# Patient Record
Sex: Male | Born: 1955 | Race: White | Hispanic: No | Marital: Married | Smoking: Never smoker
Health system: Southern US, Community
[De-identification: ages and names within clinical notes are randomized; demographics above are authoritative.]

## PROBLEM LIST (undated history)

## (undated) DIAGNOSIS — B0229 Other postherpetic nervous system involvement: Secondary | ICD-10-CM

## (undated) DIAGNOSIS — E785 Hyperlipidemia, unspecified: Secondary | ICD-10-CM

## (undated) DIAGNOSIS — M5432 Sciatica, left side: Secondary | ICD-10-CM

## (undated) DIAGNOSIS — Q632 Ectopic kidney: Secondary | ICD-10-CM

## (undated) DIAGNOSIS — R519 Headache, unspecified: Secondary | ICD-10-CM

## (undated) DIAGNOSIS — K219 Gastro-esophageal reflux disease without esophagitis: Secondary | ICD-10-CM

## (undated) DIAGNOSIS — T7840XA Allergy, unspecified, initial encounter: Secondary | ICD-10-CM

## (undated) HISTORY — PX: RHINOPLASTY: SUR1284

## (undated) HISTORY — DX: Hyperlipidemia, unspecified: E78.5

## (undated) HISTORY — PX: HERNIA REPAIR: SHX51

## (undated) HISTORY — PX: KNEE SURGERY: SHX244

## (undated) HISTORY — PX: EPIDURAL BLOCK INJECTION: SHX1516

## (undated) HISTORY — PX: SPINE SURGERY: SHX786

## (undated) HISTORY — DX: Allergy, unspecified, initial encounter: T78.40XA

## (undated) HISTORY — DX: Gastro-esophageal reflux disease without esophagitis: K21.9

## (undated) HISTORY — DX: Sciatica, left side: M54.32

## (undated) HISTORY — DX: Other postherpetic nervous system involvement: B02.29

---
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
## 2005-05-23 HISTORY — PX: LUMBAR DISC SURGERY: SHX700

## 2017-02-23 ENCOUNTER — Ambulatory Visit (INDEPENDENT_AMBULATORY_CARE_PROVIDER_SITE_OTHER): Payer: BC Managed Care – PPO | Admitting: Family Medicine

## 2017-02-23 ENCOUNTER — Encounter: Payer: Self-pay | Admitting: Family Medicine

## 2017-02-23 VITALS — BP 131/79 | HR 60 | Temp 97.9°F | Ht 69.5 in | Wt 185.0 lb

## 2017-02-23 DIAGNOSIS — E785 Hyperlipidemia, unspecified: Secondary | ICD-10-CM | POA: Insufficient documentation

## 2017-02-23 DIAGNOSIS — M5442 Lumbago with sciatica, left side: Secondary | ICD-10-CM | POA: Diagnosis not present

## 2017-02-23 NOTE — Progress Notes (Signed)
   Subjective:    Patient ID: Willie Carpenter, male    DOB: [birth year], 61 y.o.   MRN: [id number]  HPI 61 yr old male to establish with [place] after moving to [place] from [place] about 4 weeks ago. 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 He has a history of low back pain and he had lumbar disc surgery in 2007. This has been well controlled over the year with minor flare ups until the past 2 weeks. Now he is having more severe sharp pains in the left lower back which radiate into the left buttock and left posterior thigh. No numbness or weakness. He has been doing a lot of packing, unpacking, and lifting heavy boxes lately. He has used heat and Ibuprofen with mixed results. He does stretches for his hamstrings. He asks to be referred for PT for this.    Review of Systems  Constitutional: Negative.   Respiratory: Negative.   Cardiovascular: Negative.   Musculoskeletal: Positive for back pain. Negative for gait problem.  Neurological: Negative.        Objective:   Physical Exam  Constitutional: He is oriented to person, place, and time. He appears well-developed and well-nourished. No distress.  Normal gait   Cardiovascular: Normal rate, regular rhythm, normal heart sounds and intact distal pulses.   Pulmonary/Chest: Effort normal and breath sounds normal.  Musculoskeletal:  The lower back is not tender but there is some minor spasm present. ROM is full and SLR are negative.   Neurological: He is alert and oriented to person, place, and time.          Assessment & Plan:  Introductory visit with a patient having low back pain with sciatica. He will use heat and Ibuprofen prn. We will refer him to PT for further treatment. Once this settles down we will plan on giving hi a well exam and getting fasting labs.  Gershon Crane, MD

## 2017-02-23 NOTE — Patient Instructions (Signed)
WE NOW OFFER   [facility] FAST TRACK!!!  SAME DAY Appointments for ACUTE CARE  Such as: Sprains, Injuries, cuts, abrasions, rashes, muscle pain, joint pain, back pain Colds, flu, sore throats, headache, allergies, cough, fever  Ear pain, sinus and eye infections Abdominal pain, nausea, vomiting, diarrhea, upset stomach Animal/insect bites  3 Easy Ways to Schedule: Walk-In Scheduling Call in scheduling Mychart Sign-up: [email or web address]         

## 2017-02-28 ENCOUNTER — Ambulatory Visit: Payer: BC Managed Care – PPO | Attending: Family Medicine | Admitting: Physical Therapy

## 2017-02-28 DIAGNOSIS — M5442 Lumbago with sciatica, left side: Secondary | ICD-10-CM | POA: Insufficient documentation

## 2017-02-28 DIAGNOSIS — M6281 Muscle weakness (generalized): Secondary | ICD-10-CM | POA: Insufficient documentation

## 2017-02-28 NOTE — Therapy (Signed)
[facility] [address] [place], [place], [address] Phone: [phone]   Fax:  [phone]  Physical Therapy Evaluation  Patient Details  Name: Willie Carpenter MRN: [id number] Date of Birth: [birth year] Referring Provider: Dr. Gershon Crane  Encounter Date: 02/28/2017      PT End of Session - 02/28/17 2155    Visit Number 1   Date for PT Re-Evaluation 04/25/17   Authorization Type BCBS   PT Start Time 1532   PT Stop Time 1615   PT Time Calculation (min) 43 min   Activity Tolerance Patient tolerated treatment well      Past Medical History:  Diagnosis Date  . Hyperlipidemia   . Postherpetic neuralgia   . Sciatica, left side     Past Surgical History:  Procedure Laterality Date  . COLONOSCOPY  2006   clear, done in [place]   . EPIDURAL BLOCK INJECTION  Feb 2008 and May 2008   lumbar, in [place]   . LUMBAR DISC SURGERY  2007   in [place], at L5-S1   . SPINE SURGERY    . TONSILLECTOMY AND ADENOIDECTOMY  1972    There were no vitals filed for this visit.       Subjective Assessment - 02/28/17 1531    Subjective In 2007 had HNP with Left LE symptoms.  Had 2 nerve blocks subsided in 6 months.  Last few weeks had a twinge down left side especially with reaching overhead or with prolonged sitting.  Uses standing desk.  Did a lot of traveling in July ([place] and [place]).  Moving packing and unpacking may have strained my back.  Was going to Pilates class 1x/week in [place] and physiotherapist after knee surgery.  Not as physically active recently.  Rides bike.      Limitations Sitting;Lifting   Diagnostic tests not recently   Patient Stated Goals refer to good Pilates class;  improve flexibilty of hamstrings   Currently in Pain? No/denies   Pain Score 0-No pain   Pain Location Hip   Pain Orientation Left   Pain Type Acute pain   Pain Radiating Towards left thigh posterior to the knee    Aggravating Factors  reaching overhead, sitting; riding bike?; walk several miles around [facility]   Pain Relieving Factors activity            [facility] PT Assessment - 02/28/17 0001      Assessment   Medical Diagnosis acute low back pain with left sciatica   Referring Provider Dr. Gershon Crane   Onset Date/Surgical Date --  3 weeks   Next MD Visit as needed   Prior Therapy in [place]     Precautions   Precautions None     Restrictions   Weight Bearing Restrictions No     Balance Screen   Has the patient fallen in the past 6 months No   Has the patient had a decrease in activity level because of a fear of falling?  No   Is the patient reluctant to leave their home because of a fear of falling?  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[employer], running, pilates     Observation/Other Assessments   Focus  on Therapeutic Outcomes (FOTO)  46% limitation     Posture/Postural Control   Posture/Postural Control Postural limitations   Postural Limitations Decreased lumbar lordosis     AROM   Lumbar Flexion 40   Lumbar Extension 20   Lumbar - Right Side Bend 35   Lumbar - Left Side Bend 35     Strength   Lumbar Flexion 4/5   Lumbar Extension 4/5     Flexibility   Hamstrings 75 bil     Palpation   Palpation comment no tenderness     Slump test   Findings Negative     Prone Knee Bend Test   Findings Negative     Straight Leg Raise   Findings Negative            Objective measurements completed on examination: See above findings.                  PT Education - 02/28/17 1609    Education provided Yes   Education Details Hamstring stretch (also with contract-relax method) prone press ups;  community options for Pilates   Person(s)  Educated Patient   Methods Explanation;Demonstration;Handout   Comprehension Verbalized understanding;Returned demonstration          PT Short Term Goals - 02/28/17 2204      PT SHORT TERM GOAL #1   Title The patient will express understanding of basic self care strategies including postural considerations, basic HEP   Time 4   Period Weeks   Status New   Target Date 03/28/17     PT SHORT TERM GOAL #2   Title The patient will report a 40% improvement in back, hip and thigh symptoms with usual home, work activities   Time 4   Period Weeks   Status New     PT SHORT TERM GOAL #3   Title Lumbar flexion to 55 degrees and HS length to 80 degrees needed for playing golf and other recreational activities   Time 4   Period Weeks   Status New           PT Long Term Goals - 02/28/17 2207      PT LONG TERM GOAL #1   Title The patient will be independent in safe self progression of HEP    Time 8   Period Weeks   Status New   Target Date 04/25/17     PT LONG TERM GOAL #2   Title The patient will report a 75% improvement in pain with usual home,work and recreational activities.   Time 8   Period Weeks   Status New     PT LONG TERM GOAL #3   Title Core strength improved to 5-/5 needed for moderate and vigorous recreational activities   Time 8   Period Weeks   Status New     PT LONG TERM GOAL #4   Title FOTO functional outcome score improved from 46% limitation to 29% limitation   Time 8   Period Weeks   Status New                Plan - 02/28/17 2156    Clinical Impression Statement The patient has a history of LBP/herniated disch and radiculopathy 10 years ago which resolved which nerve blocks, physiotherapy and Pilates while he lived in [place].  Over the summer, he traveled extensively and has relocated to [place].  He has been sitting more on long flights, lifting and moving boxes and  has not been as active.  As a result he has felt some left back,  hip and posterior thigh pain to the knee.  Lumbar flexion ROM is very limited.   Repeated movement testing reveals a possible preference for extension.  Decreased HS length to 75 degrees bilaterally which patient is very concerned about.  Decreased activation of transverse abdominus and lumbar multifidi.  Decreased lumbar lordosis.  No neural signs:  negative SLR, slump test and prone knee bend.  He would benefit from PT to address these deficits and help him return to previous level of functional activity.     History and Personal Factors relevant to plan of care: good home support;  minimal co-morbidities; previous back history   Clinical Presentation Stable   Clinical Decision Making Low   Rehab Potential Good   PT Frequency 2x / week   PT Duration 8 weeks   PT Treatment/Interventions ADLs/Self Care Home Management;Cryotherapy;Electrical Stimulation;Ultrasound;Moist Heat;Traction;Therapeutic activities;Therapeutic exercise;Neuromuscular re-education;Manual techniques;Dry needling;Taping   PT Next Visit Plan assess response to extension bias ex and add overpressure as appropriate;  manual stretch of HS;  add piriformis stretch;  abdominal brace series;  start lumbar multifidi activation      Patient will benefit from skilled therapeutic intervention in order to improve the following deficits and impairments:  Decreased strength, Decreased range of motion, Postural dysfunction, Pain  Visit Diagnosis: Acute left-sided low back pain with left-sided sciatica - Plan: PT plan of care cert/re-cert  Muscle weakness (generalized) - Plan: PT plan of care cert/re-cert     Problem List Patient Active Problem List   Diagnosis Date Noted  . Dyslipidemia 02/23/2017   Lavinia Sharps, PT 02/28/17 10:12 PM Phone: [phone] Fax: [phone]  Vivien Presto 02/28/2017, 10:11 PM  [facility] [address] [place], [place],  [address] Phone: [phone]   Fax:  [phone]  Name: Willie Carpenter MRN: [id number] Date of Birth: [birth year]

## 2017-02-28 NOTE — Patient Instructions (Signed)
       HAMSTRING STRETCH WITH TOWEL  While lying down on your back, hook a towel or strap under  your foot and draw up your leg until a stretch is felt under your leg. calf area.  Keep your knee in a straightened position during the stretch.  Hold 20 seconds, perform 3 repetitions.  3 times a day.    [facility] [address] [place], [place] [address] Phone # [phone] Fax [phone]     PT Pilates  Lavinia Sharps PT [facility] [address] [place], [place] [address] Phone # [phone] Fax [phone]

## 2017-03-02 ENCOUNTER — Ambulatory Visit: Payer: BC Managed Care – PPO | Admitting: Physical Therapy

## 2017-03-02 DIAGNOSIS — M6281 Muscle weakness (generalized): Secondary | ICD-10-CM

## 2017-03-02 DIAGNOSIS — M5442 Lumbago with sciatica, left side: Secondary | ICD-10-CM | POA: Diagnosis not present

## 2017-03-02 NOTE — Patient Instructions (Signed)
Isometric Oblique Stabilization  From a supine position with both knees bent, bring one knee up to 90 degrees. Resist the lifted knee with the opposite arm to contract the obliques while keeping the head and shoulders on the mat. There should be no movement of the knee during the oblique contraction x10 reps each side, 3 sec hold

## 2017-03-02 NOTE — Therapy (Signed)
[facility] [address] [place], [place], [address] Phone: [phone]   Fax:  [phone]  Physical Therapy Treatment  Patient Details  Name: Willie Carpenter MRN: [id number] Date of Birth: 1956-02-08 Referring Provider: Dr. Gershon Crane  Encounter Date: 03/02/2017      PT End of Session - 03/02/17 1038    Visit Number 2   Date for PT Re-Evaluation 04/25/17   Authorization Type BCBS   PT Start Time [phone]  Pt arrived late    PT Stop Time 1016   PT Time Calculation (min) 38 min   Activity Tolerance Patient tolerated treatment well;No increased pain   Behavior During Therapy WFL for tasks assessed/performed      Past Medical History:  Diagnosis Date  . Hyperlipidemia   . Postherpetic neuralgia   . Sciatica, left side     Past Surgical History:  Procedure Laterality Date  . COLONOSCOPY  2006   clear, done in [place]   . EPIDURAL BLOCK INJECTION  Feb 2008 and May 2008   lumbar, in [place]   . LUMBAR DISC SURGERY  2007   in [place], at L5-S1   . SPINE SURGERY    . TONSILLECTOMY AND ADENOIDECTOMY  1972    There were no vitals filed for this visit.      Subjective Assessment - 03/02/17 0939    Subjective Pt reports that things are going well. He has been working on his HEP since the last session. He still has some pain into his Lt hip region when reaching overhead or after completing alot of exercise.    Limitations Sitting;Lifting   Diagnostic tests not recently   Patient Stated Goals refer to good Pilates class;  improve flexibilty of hamstrings   Currently in Pain? No/denies                   [facility] Adult PT Treatment/Exercise - 03/02/17 0001      Exercises   Exercises Lumbar     Lumbar Exercises: Stretches   Active Hamstring Stretch 5 reps;10 seconds   Press Ups Limitations x10 reps    Piriformis Stretch 30 seconds;2 reps   Piriformis Stretch Limitations supine       Lumbar Exercises: Supine   Large Ball Abdominal Isometric 10 reps;3 seconds   Other Supine Lumbar Exercises BUE press into phyioball during breathing and abdominal bracing     Lumbar Exercises: Prone   Other Prone Lumbar Exercises pressups x10 reps beginning      Manual Therapy   Manual Therapy Muscle Energy Technique   Muscle Energy Technique contract, relax stretch to hamstrings each LE; B piriformis stretch                 PT Education - 03/02/17 1024    Education provided Yes   Education Details addition of deep abdominal activation to HEP; technique with therex   Person(s) Educated Patient   Methods Explanation;Tactile cues;Verbal cues;Handout   Comprehension Verbalized understanding;Returned demonstration          PT Short Term Goals - 02/28/17 2204      PT SHORT TERM GOAL #1   Title The patient will express understanding of basic self care strategies including postural considerations, basic HEP   Time 4   Period Weeks   Status New   Target Date 03/28/17     PT SHORT TERM GOAL #2   Title The patient will report a 40% improvement  in back, hip and thigh symptoms with usual home, work activities   Time 4   Period Weeks   Status New     PT SHORT TERM GOAL #3   Title Lumbar flexion to 55 degrees and HS length to 80 degrees needed for playing golf and other recreational activities   Time 4   Period Weeks   Status New           PT Long Term Goals - 02/28/17 2207      PT LONG TERM GOAL #1   Title The patient will be independent in safe self progression of HEP    Time 8   Period Weeks   Status New   Target Date 04/25/17     PT LONG TERM GOAL #2   Title The patient will report a 75% improvement in pain with usual home,work and recreational activities.   Time 8   Period Weeks   Status New     PT LONG TERM GOAL #3   Title Core strength improved to 5-/5 needed for moderate and vigorous recreational activities   Time 8   Period Weeks   Status New      PT LONG TERM GOAL #4   Title FOTO functional outcome score improved from 46% limitation to 29% limitation   Time 8   Period Weeks   Status New               Plan - 03/02/17 1038    Clinical Impression Statement Pt arrived today without report of pain, noting full HEP adherence since his evaluation 2 days ago. Session focused on manual stretching and therex to improve hip and lumbar mobility. Made additions to pt's HEP to incorporate transverse abdominus activation, noting overall good technique and understanding of today's exercises. Ended without report of low back pain. Will continue with current POC.      Rehab Potential Good   PT Frequency 2x / week   PT Duration 8 weeks   PT Treatment/Interventions ADLs/Self Care Home Management;Cryotherapy;Electrical Stimulation;Ultrasound;Moist Heat;Traction;Therapeutic activities;Therapeutic exercise;Neuromuscular re-education;Manual techniques;Dry needling;Taping   PT Next Visit Plan trial overpressure to prone pressups to assess symptom response; progress abdominal bracing therex;  start lumbar multifidi activation   Recommended Other Services MD signed orders   Consulted and Agree with Plan of Care Patient      Patient will benefit from skilled therapeutic intervention in order to improve the following deficits and impairments:  Decreased strength, Decreased range of motion, Postural dysfunction, Pain  Visit Diagnosis: Acute left-sided low back pain with left-sided sciatica  Muscle weakness (generalized)     Problem List Patient Active Problem List   Diagnosis Date Noted  . Dyslipidemia 02/23/2017   12:32 PM,03/02/17 Marylyn Ishihara PT, DPT [facility]  [phone]  [facility] [address] [place], [place], [address] Phone: [phone]   Fax:  [phone]  Name: Willie Carpenter MRN: [id number] Date of Birth:  31-Aug-1955

## 2017-03-09 ENCOUNTER — Ambulatory Visit: Payer: BC Managed Care – PPO

## 2017-03-09 DIAGNOSIS — M5442 Lumbago with sciatica, left side: Secondary | ICD-10-CM

## 2017-03-09 DIAGNOSIS — M6281 Muscle weakness (generalized): Secondary | ICD-10-CM

## 2017-03-09 NOTE — Therapy (Signed)
[facility] [address] [place], [place], [address] Phone: [phone]   Fax:  [phone]  Physical Therapy Treatment  Patient Details  Name: Willie Carpenter MRN: [id number] Date of Birth: [birth year] Referring Provider: Dr. Gershon Crane  Encounter Date: 03/09/2017      PT End of Session - 03/09/17 1228    Visit Number 3   Date for PT Re-Evaluation 04/25/17   PT Start Time 1155   PT Stop Time 1245   PT Time Calculation (min) 50 min   Activity Tolerance Patient tolerated treatment well   Behavior During Therapy [facility] for tasks assessed/performed      Past Medical History:  Diagnosis Date  . Hyperlipidemia   . Postherpetic neuralgia   . Sciatica, left side     Past Surgical History:  Procedure Laterality Date  . COLONOSCOPY  2006   clear, done in [place]   . EPIDURAL BLOCK INJECTION  Feb 2008 and May 2008   lumbar, in [place]   . LUMBAR DISC SURGERY  2007   in [place], at L5-S1   . SPINE SURGERY    . TONSILLECTOMY AND ADENOIDECTOMY  1972    There were no vitals filed for this visit.      Subjective Assessment - 03/09/17 1156    Subjective Pt reports no change in Lt LE radiculopathy.     Currently in Pain? Yes   Pain Score 4    Pain Location Hip   Pain Orientation Left   Pain Type Acute pain   Pain Onset More than a month ago   Pain Frequency Intermittent   Aggravating Factors  reaching overhead, rowing exercxie   Pain Relieving Factors activity, change of position                         [facility] Adult PT Treatment/Exercise - 03/09/17 0001      Lumbar Exercises: Stretches   Active Hamstring Stretch 3 reps;20 seconds   Piriformis Stretch 30 seconds;2 reps   Piriformis Stretch Limitations seated     Lumbar Exercises: Prone   Single Arm Raise Left;Right;20 reps   Straight Leg Raise 20 reps   Opposite Arm/Leg Raise Left arm/Right leg;Right arm/Left leg;20 reps   Other  Prone Lumbar Exercises pressups x10 reps beginning      Modalities   Modalities Traction     Traction   Type of Traction Lumbar   Min (lbs) 50   Max (lbs) 85   Hold Time 60   Rest Time 20   Time 15     Manual Therapy   Manual Therapy Myofascial release   Manual therapy comments contract relax with passive stretch to bil hamstring                PT Education - 03/09/17 1215    Education provided Yes   Education Details seated hamstring and piriformis, prone    Person(s) Educated Patient   Methods Explanation;Demonstration;Handout   Comprehension Verbalized understanding;Returned demonstration          PT Short Term Goals - 02/28/17 2204      PT SHORT TERM GOAL #1   Title The patient will express understanding of basic self care strategies including postural considerations, basic HEP   Time 4   Period Weeks   Status New   Target Date 03/28/17     PT SHORT TERM GOAL #2   Title The patient  will report a 40% improvement in back, hip and thigh symptoms with usual home, work activities   Time 4   Period Weeks   Status New     PT SHORT TERM GOAL #3   Title Lumbar flexion to 55 degrees and HS length to 80 degrees needed for playing golf and other recreational activities   Time 4   Period Weeks   Status New           PT Long Term Goals - 02/28/17 2207      PT LONG TERM GOAL #1   Title The patient will be independent in safe self progression of HEP    Time 8   Period Weeks   Status New   Target Date 04/25/17     PT LONG TERM GOAL #2   Title The patient will report a 75% improvement in pain with usual home,work and recreational activities.   Time 8   Period Weeks   Status New     PT LONG TERM GOAL #3   Title Core strength improved to 5-/5 needed for moderate and vigorous recreational activities   Time 8   Period Weeks   Status New     PT LONG TERM GOAL #4   Title FOTO functional outcome score improved from 46% limitation to 29% limitation    Time 8   Period Weeks   Status New               Plan - 03/09/17 1209    Clinical Impression Statement Pt with continued Lt buttock/hamstring pain.  PT issued HEP for seated flexibility and prone extensor strength.  Trial of traction today to impact Lt LE radiculopathy.  Pt tolerated all exercise without increased pain.  Pt will continue to benefit from skilled PT for core strength, flexibility and traction if helpful.     Rehab Potential Good   PT Frequency 2x / week   PT Duration 8 weeks   PT Treatment/Interventions ADLs/Self Care Home Management;Cryotherapy;Electrical Stimulation;Ultrasound;Moist Heat;Traction;Therapeutic activities;Therapeutic exercise;Neuromuscular re-education;Manual techniques;Dry needling;Taping   PT Next Visit Plan assess response to traction, continue if helpful.  reveiw HEP issued today.     Consulted and Agree with Plan of Care Patient      Patient will benefit from skilled therapeutic intervention in order to improve the following deficits and impairments:  Decreased strength, Decreased range of motion, Postural dysfunction, Pain  Visit Diagnosis: Acute left-sided low back pain with left-sided sciatica  Muscle weakness (generalized)     Problem List Patient Active Problem List   Diagnosis Date Noted  . Dyslipidemia 02/23/2017     Willie Carpenter, PT 03/09/17 12:31 PM  [facility] [address] [place], [place], [address] Phone: [phone]   Fax:  [phone]  Name: Willie Carpenter MRN: [id number] Date of Birth: July 10, 1955

## 2017-03-09 NOTE — Patient Instructions (Addendum)
HIP: Hamstrings - Short Sitting    Rest leg on raised surface. Keep knee straight. Lift chest. Hold _20__ seconds. _3__ reps per set, _3_ sets per day, ___ days per week  Copyright  VHI. All rights reserved.  Piriformis Stretch, Sitting    Sit, one ankle on opposite knee, same-side hand on crossed knee. Push down on knee, keeping spine straight. Lean torso forward, with flat back, until tension is felt in hamstrings and gluteals of crossed-leg side. Hold _20__ seconds.  Repeat _3__ times per session. Do _3__ sessions per day.  Copyright  VHI. All rights reserved.  Arm / Leg Lift: Opposite (Prone)   Lift right leg and opposite arm ____ inches from floor, keeping knee locked. Repeat __10__ times per set. Do __1-2__ sets per session. Do __1__ sessions per day.   Straight Leg Raise (Prone)   Abdomen and head supported, keep left knee locked and raise leg at hip. Avoid arching low back. Repeat _10___ times per set. Do _1-2___ sets per session. Do _2___ sessions per day.      Straight Arm Lift: Prone   Arm straight out STRAIGHT Keeping thumb up, lift arm. Keep pelvis still. Do 10 __ times, 1-2 sets, __1_ times per day.   [facility] [address], [address] [place], [place] [address] Phone # [phone] Fax [phone]

## 2017-03-13 ENCOUNTER — Ambulatory Visit: Payer: BC Managed Care – PPO

## 2017-03-13 DIAGNOSIS — M6281 Muscle weakness (generalized): Secondary | ICD-10-CM

## 2017-03-13 DIAGNOSIS — M5442 Lumbago with sciatica, left side: Secondary | ICD-10-CM

## 2017-03-13 NOTE — Therapy (Signed)
[facility] [address] [place], [place], [address] Phone: [phone]   Fax:  [phone]  Physical Therapy Treatment  Patient Details  Name: Willie Carpenter MRN: [id number] Date of Birth: March 24, 1956 Referring Provider: Dr. Gershon Crane  Encounter Date: 03/13/2017      PT End of Session - 03/13/17 1440    Visit Number 4   Date for PT Re-Evaluation 04/25/17   PT Start Time 1406   PT Stop Time 1457   PT Time Calculation (min) 51 min   Activity Tolerance Patient tolerated treatment well   Behavior During Therapy [facility] for tasks assessed/performed      Past Medical History:  Diagnosis Date  . Hyperlipidemia   . Postherpetic neuralgia   . Sciatica, left side     Past Surgical History:  Procedure Laterality Date  . COLONOSCOPY  2006   clear, done in [place]   . EPIDURAL BLOCK INJECTION  Feb 2008 and May 2008   lumbar, in [place]   . LUMBAR DISC SURGERY  2007   in [place], at L5-S1   . SPINE SURGERY    . TONSILLECTOMY AND ADENOIDECTOMY  1972    There were no vitals filed for this visit.      Subjective Assessment - 03/13/17 1408    Subjective I was achy after traction.  No change in symptoms.     Patient Stated Goals refer to good Pilates class;  improve flexibilty of hamstrings   Currently in Pain? Yes   Pain Score 4    Pain Location Hip   Pain Orientation Left   Pain Descriptors / Indicators Aching;Sore   Pain Type Acute pain   Pain Onset More than a month ago   Pain Frequency Intermittent   Aggravating Factors  reaching overhead, rowing   Pain Relieving Factors activity, change of position                         [facility] Adult PT Treatment/Exercise - 03/13/17 0001      Lumbar Exercises: Stretches   Active Hamstring Stretch 3 reps;20 seconds  seated and supine with strap   Piriformis Stretch 30 seconds;2 reps   Piriformis Stretch Limitations seated     Lumbar  Exercises: Supine   Bridge 5 seconds;20 reps     Lumbar Exercises: Sidelying   Clam 20 reps     Lumbar Exercises: Prone   Single Arm Raise Left;Right;20 reps   Straight Leg Raise 20 reps   Opposite Arm/Leg Raise Left arm/Right leg;Right arm/Left leg;20 reps   Other Prone Lumbar Exercises pressups x10 reps beginning      Modalities   Modalities Traction     Traction   Type of Traction Lumbar   Min (lbs) 50   Max (lbs) 85   Hold Time 60   Rest Time 20   Time 15                  PT Short Term Goals - 02/28/17 2204      PT SHORT TERM GOAL #1   Title The patient will express understanding of basic self care strategies including postural considerations, basic HEP   Time 4   Period Weeks   Status New   Target Date 03/28/17     PT SHORT TERM GOAL #2   Title The patient will report a 40% improvement in back, hip and thigh symptoms with usual home,  work activities   Time 4   Period Weeks   Status New     PT SHORT TERM GOAL #3   Title Lumbar flexion to 55 degrees and HS length to 80 degrees needed for playing golf and other recreational activities   Time 4   Period Weeks   Status New           PT Long Term Goals - 02/28/17 2207      PT LONG TERM GOAL #1   Title The patient will be independent in safe self progression of HEP    Time 8   Period Weeks   Status New   Target Date 04/25/17     PT LONG TERM GOAL #2   Title The patient will report a 75% improvement in pain with usual home,work and recreational activities.   Time 8   Period Weeks   Status New     PT LONG TERM GOAL #3   Title Core strength improved to 5-/5 needed for moderate and vigorous recreational activities   Time 8   Period Weeks   Status New     PT LONG TERM GOAL #4   Title FOTO functional outcome score improved from 46% limitation to 29% limitation   Time 8   Period Weeks   Status New               Plan - 03/13/17 1417    Clinical Impression Statement Pt reports  very minimal change in Lt buttock pain.  Pt has been compliant in body mechanics modifications and HEP for extension and flexibility.  Pt tolerates all exercise without increase in symptoms.  Pt is not sure of impact of traction and tried again today to determine if change in Lt leg symptoms.  Pt will continue to benefit from skilled PT for extension based exercise, strength and flexibility.     Rehab Potential Good   PT Frequency 2x / week   PT Duration 8 weeks   PT Treatment/Interventions ADLs/Self Care Home Management;Cryotherapy;Electrical Stimulation;Ultrasound;Moist Heat;Traction;Therapeutic activities;Therapeutic exercise;Neuromuscular re-education;Manual techniques;Dry needling;Taping   PT Next Visit Plan assess response to traction, continue if helpful.  Add bridging and clam shells to HEP.   Consulted and Agree with Plan of Care Patient      Patient will benefit from skilled therapeutic intervention in order to improve the following deficits and impairments:  Decreased strength, Decreased range of motion, Postural dysfunction, Pain  Visit Diagnosis: Acute left-sided low back pain with left-sided sciatica  Muscle weakness (generalized)     Problem List Patient Active Problem List   Diagnosis Date Noted  . Dyslipidemia 02/23/2017   Lorrene ReidKelly Wynter Grave, PT 03/13/17 2:41 PM   [facility] [address] [place], [place], [address] Phone: [phone]   Fax:  [phone]  Name: Willie Carpenter MRN: [id number] Date of Birth: April 22, 1956

## 2017-03-15 ENCOUNTER — Encounter: Payer: BC Managed Care – PPO | Admitting: Physical Therapy

## 2017-03-17 ENCOUNTER — Ambulatory Visit: Payer: BC Managed Care – PPO | Admitting: Physical Therapy

## 2017-03-17 DIAGNOSIS — M5442 Lumbago with sciatica, left side: Secondary | ICD-10-CM | POA: Diagnosis not present

## 2017-03-17 DIAGNOSIS — M6281 Muscle weakness (generalized): Secondary | ICD-10-CM

## 2017-03-17 NOTE — Patient Instructions (Signed)
  LUMBAR ROLL  Use a small lumbar roll in chairs you sit at frequently. Place the roll at the lower curve of your back. This will help you maintain better posture.    Towel roll.       Thoracic Rotation Mobility   Lying on your side with your top leg positioned as shown bring your top arm over without allowing your knee to lift off the ground. You should feel a gentle stretch in your upper back/thoracic spine.   x15 reps each side, hold 5 sec        Alternating Leg Extension (Quadruped)  Begin on hands and knees with knees directly under your hips and hands directly under your shoulders. Keep your abdominals tight and engaged throughout this exercise. Raise one leg straight back as pictured without letting your hips drop to one side and without losing your abdominal contraction. Hold for 2 seconds, then return to the start position and repeat with the opposite leg. This is one repetition. Hold 5 sec on the Lt leg, x10 reps each   [facility] [address], [address] [place], [place] [address] Phone # [phone] Fax [phone]

## 2017-03-17 NOTE — Therapy (Signed)
[facility] [address] [place], [place], [address] Phone: [phone]   Fax:  [phone]  Physical Therapy Treatment  Patient Details  Name: Willie Carpenter MRN: [id number] Date of Birth: [birth year] Referring Provider: Dr. Gershon Crane  Encounter Date: 03/17/2017      PT End of Session - 03/17/17 1128    Visit Number 5   Date for PT Re-Evaluation 04/25/17   PT Start Time 0935   PT Stop Time 1017   PT Time Calculation (min) 42 min   Activity Tolerance Patient tolerated treatment well;No increased pain   Behavior During Therapy [facility] for tasks assessed/performed      Past Medical History:  Diagnosis Date  . Hyperlipidemia   . Postherpetic neuralgia   . Sciatica, left side     Past Surgical History:  Procedure Laterality Date  . COLONOSCOPY  2006   clear, done in [place]   . EPIDURAL BLOCK INJECTION  Feb 2008 and May 2008   lumbar, in [place]   . LUMBAR DISC SURGERY  2007   in [place], at L5-S1   . SPINE SURGERY    . TONSILLECTOMY AND ADENOIDECTOMY  1972    There were no vitals filed for this visit.      Subjective Assessment - 03/17/17 0935    Subjective Pt reports that he thinks things might be improving, but it is still an annoyance for him. He has been trying to do some pilates on his own at home. He does not feel that the traction is doing much for him and requests we hold off on that for now.    Patient Stated Goals refer to good Pilates class;  improve flexibilty of hamstrings   Currently in Pain? Yes   Pain Score 3    Pain Location Buttocks   Pain Orientation Left   Pain Descriptors / Indicators Aching   Pain Type Acute pain   Pain Radiating Towards Lt posterior thigh   Pain Onset More than a month ago   Pain Frequency Intermittent   Aggravating Factors  reaching overhead, prolonged sitting    Pain Relieving Factors activity, change of position                           [facility] Adult PT Treatment/Exercise - 03/17/17 0001      Lumbar Exercises: Stretches   Active Hamstring Stretch 1 rep;30 seconds   Active Hamstring Stretch Limitations with strap to ensure pt is using proper technique at home.      Lumbar Exercises: Sidelying   Other Sidelying Lumbar Exercises thoracic rotation x10 reps, 5 sec hold each      Lumbar Exercises: Prone   Other Prone Lumbar Exercises Prone press ups x10 reps, x5 reps with 5 sec over pressure     Manual Therapy   Manual Therapy Soft tissue mobilization;Joint mobilization   Joint Mobilization CPAs L2 to S1, grade II-IV   Soft tissue mobilization STM Lt medial hamstring, Lt lumbar paraspinals, QL                 PT Education - 03/17/17 1128    Education provided Yes   Education Details additions to HEP    Person(s) Educated Patient   Methods Explanation;Handout   Comprehension Verbalized understanding;Returned demonstration          PT Short Term Goals - 02/28/17 2204      PT SHORT  TERM GOAL #1   Title The patient will express understanding of basic self care strategies including postural considerations, basic HEP   Time 4   Period Weeks   Status New   Target Date 03/28/17     PT SHORT TERM GOAL #2   Title The patient will report a 40% improvement in back, hip and thigh symptoms with usual home, work activities   Time 4   Period Weeks   Status New     PT SHORT TERM GOAL #3   Title Lumbar flexion to 55 degrees and HS length to 80 degrees needed for playing golf and other recreational activities   Time 4   Period Weeks   Status New           PT Long Term Goals - 02/28/17 2207      PT LONG TERM GOAL #1   Title The patient will be independent in safe self progression of HEP    Time 8   Period Weeks   Status New   Target Date 04/25/17     PT LONG TERM GOAL #2   Title The patient will report a 75% improvement in pain with usual home,work and  recreational activities.   Time 8   Period Weeks   Status New     PT LONG TERM GOAL #3   Title Core strength improved to 5-/5 needed for moderate and vigorous recreational activities   Time 8   Period Weeks   Status New     PT LONG TERM GOAL #4   Title FOTO functional outcome score improved from 46% limitation to 29% limitation   Time 8   Period Weeks   Status New               Plan - 03/17/17 1153    Clinical Impression Statement Pt arrives reporting some improvements in Lt buttock pain overall since the start of therapy. Session focused on manual techniques to address lumbar/thoracic mobility and decrease muscle spasm along the hamstring and Lt paraspinals. Introduced quadruped therex to address pt's core stability, noting increased difficulty on the Lt compared to the Rt. Updated pt's HEP to follow up with manual techniques performed and increase core stability asymmetries. Pt reported no increase in pain during today's session of following today's activities.    Rehab Potential Good   PT Frequency 2x / week   PT Duration 8 weeks   PT Treatment/Interventions ADLs/Self Care Home Management;Cryotherapy;Electrical Stimulation;Ultrasound;Moist Heat;Traction;Therapeutic activities;Therapeutic exercise;Neuromuscular re-education;Manual techniques;Dry needling;Taping   PT Next Visit Plan progress trunk stability/endurance; lumbar mobility   Consulted and Agree with Plan of Care Patient      Patient will benefit from skilled therapeutic intervention in order to improve the following deficits and impairments:  Decreased strength, Decreased range of motion, Postural dysfunction, Pain  Visit Diagnosis: Acute left-sided low back pain with left-sided sciatica  Muscle weakness (generalized)     Problem List Patient Active Problem List   Diagnosis Date Noted  . Dyslipidemia 02/23/2017    12:01 PM,03/17/17 Marylyn IshiharaSara Kiser PT, DPT [facility]   [phone]  [facility] [address] [place], [place], [address] Phone: [phone]   Fax:  [phone]  Name: Willie Carpenter MRN: [id number] Date of Birth: 03-04-1956

## 2017-03-21 ENCOUNTER — Ambulatory Visit: Payer: BC Managed Care – PPO | Admitting: Physical Therapy

## 2017-03-21 DIAGNOSIS — M5442 Lumbago with sciatica, left side: Secondary | ICD-10-CM | POA: Diagnosis not present

## 2017-03-21 DIAGNOSIS — M6281 Muscle weakness (generalized): Secondary | ICD-10-CM

## 2017-03-21 NOTE — Therapy (Signed)
[facility] [address] [place], [place], [address] Phone: [phone]   Fax:  [phone]  Physical Therapy Treatment  Patient Details  Name: Willie Carpenter MRN: [id number] Date of Birth: 29-Dec-1955 Referring Provider: Dr. Gershon CraneStephen Fry  Encounter Date: 03/21/2017      PT End of Session - 03/21/17 1017    Visit Number 6   Date for PT Re-Evaluation 04/25/17   Authorization Type BCBS   PT Start Time 0940   PT Stop Time 1018   PT Time Calculation (min) 38 min   Activity Tolerance Patient tolerated treatment well      Past Medical History:  Diagnosis Date  . Hyperlipidemia   . Postherpetic neuralgia   . Sciatica, left side     Past Surgical History:  Procedure Laterality Date  . COLONOSCOPY  2006   clear, [place]   . EPIDURAL BLOCK INJECTION  Feb 2008 and May 2008   [place], [place]   . LUMBAR DISC SURGERY  2007   in [place], at L5-S1   . SPINE SURGERY    . TONSILLECTOMY AND ADENOIDECTOMY  1972    There were no vitals filed for this visit.      Subjective Assessment - 03/21/17 0942    Subjective I think there is a little progress.  Some tenderness low back and buttock.  I think the stretching helps.  I'm being careful with my movements.  I do the [place] and HS the most often.  I get a little achy after I bike or jog.     Patient Stated Goals refer to good Pilates class;  improve flexibilty of hamstrings   Currently in Pain? Yes   Pain Score 2    Pain Location Buttocks   Pain Orientation Left   Pain Descriptors / Indicators Aching   Aggravating Factors  sitting   Pain Relieving Factors standing, activity                    Discussion of current HEP and response.       [facility] Adult PT Treatment/Exercise - 03/21/17 0001      Lumbar Exercises: Stretches   Active Hamstring Stretch 5 reps;20 seconds   Active Hamstring Stretch Limitations with strap with abduction  and adduction bias   Piriformis Stretch 30 seconds;2 reps   Piriformis Stretch Limitations supine      Lumbar Exercises: Prone   Other Prone Lumbar Exercises Press ups with belt fixation 10x     Manual Therapy   Joint Mobilization left hip long axis distraction, inferior mob, AP in internal rotation; prone PA, in internal rotation and external rotation grad 3 3x 30 sec   Muscle Energy Technique contract, relax stretch to hamstrings each LE; B piriformis stretch                   PT Short Term Goals - 02/28/17 2204      PT SHORT TERM GOAL #1   Title The patient will express understanding of basic self care strategies including postural considerations, basic HEP   Time 4   Period Weeks   Status New   Target Date 03/28/17     PT SHORT TERM GOAL #2   Title The patient will report a 40% improvement in back, hip and thigh symptoms with usual home, work activities   Time 4   Period Weeks   Status New  PT SHORT TERM GOAL #3   Title Lumbar flexion to 55 degrees and HS length to 80 degrees needed for playing golf and other recreational activities   Time 4   Period Weeks   Status New           PT Long Term Goals - 02/28/17 2207      PT LONG TERM GOAL #1   Title The patient will be independent in safe self progression of HEP    Time 8   Period Weeks   Status New   Target Date 04/25/17     PT LONG TERM GOAL #2   Title The patient will report a 75% improvement in pain with usual home,work and recreational activities.   Time 8   Period Weeks   Status New     PT LONG TERM GOAL #3   Title Core strength improved to 5-/5 needed for moderate and vigorous recreational activities   Time 8   Period Weeks   Status New     PT LONG TERM GOAL #4   Title FOTO functional outcome score improved from 46% limitation to 29% limitation   Time 8   Period Weeks   Status New               Plan - 03/21/17 1018    Clinical Impression Statement The patient feels he is  making progress and does not feel like the addition of dry needling is necessary at this time although he has had DN on his neck in [place] a few years ago and is receptive.  Good response with hip mobilization and adding overpressure to lumbar extensions.  Hamstring length has improved significantly since initial evaluation.  On track to meet STGs next week.     Rehab Potential Good   PT Frequency 2x / week   PT Duration 8 weeks   PT Treatment/Interventions ADLs/Self Care Home Management;Cryotherapy;Electrical Stimulation;Ultrasound;Moist Heat;Traction;Therapeutic activities;Therapeutic exercise;Neuromuscular re-education;Manual techniques;Dry needling;Taping   PT Next Visit Plan progress trunk stability/endurance; lumbar mobility;  hip mobility;  STGS 11/6      Patient will benefit from skilled therapeutic intervention in order to improve the following deficits and impairments:  Decreased strength, Decreased range of motion, Postural dysfunction, Pain  Visit Diagnosis: Acute left-sided low back pain with left-sided sciatica  Muscle weakness (generalized)     Problem List Patient Active Problem List   Diagnosis Date Noted  . Dyslipidemia 02/23/2017   Lavinia Sharps, PT 03/21/17 11:29 AM Phone: [phone] Fax: [phone] Vivien Presto 03/21/2017, 11:29 AM  [facility] [address] [place], [place], [address] Phone: [phone]   Fax:  [phone]  Name: Willie Carpenter MRN: [id number] Date of Birth: [birth year]

## 2017-03-23 ENCOUNTER — Ambulatory Visit: Payer: BC Managed Care – PPO | Attending: Family Medicine | Admitting: Physical Therapy

## 2017-03-23 DIAGNOSIS — M6281 Muscle weakness (generalized): Secondary | ICD-10-CM | POA: Insufficient documentation

## 2017-03-23 DIAGNOSIS — M5442 Lumbago with sciatica, left side: Secondary | ICD-10-CM | POA: Insufficient documentation

## 2017-03-23 NOTE — Patient Instructions (Signed)
Sharyn Brilliant PT [facility] [address], [address] [place], [place] [address] Phone # [phone] Fax [phone]    

## 2017-03-23 NOTE — Therapy (Signed)
[facility] [address] [place], [place], [address] Phone: [phone]   Fax:  [phone]  Physical Therapy Treatment  Patient Details  Name: Willie Carpenter MRN: [id number] Date of Birth: [birth year] Referring Provider: Dr. Gershon Crane  Encounter Date: 03/23/2017      PT End of Session - 03/23/17 1056    Visit Number 7   Date for PT Re-Evaluation 04/25/17   Authorization Type BCBS   PT Start Time 0931   PT Stop Time 1015   PT Time Calculation (min) 44 min   Activity Tolerance Patient tolerated treatment well      Past Medical History:  Diagnosis Date  . Hyperlipidemia   . Postherpetic neuralgia   . Sciatica, left side     Past Surgical History:  Procedure Laterality Date  . COLONOSCOPY  2006   clear, done in [place]   . EPIDURAL BLOCK INJECTION  Feb 2008 and May 2008   lumbar, in [place]   . LUMBAR DISC SURGERY  2007   in [place], at L5-S1   . SPINE SURGERY    . TONSILLECTOMY AND ADENOIDECTOMY  1972    There were no vitals filed for this visit.      Subjective Assessment - 03/23/17 0930    Subjective Not bad today.  I was achy after last visit.  I went for a little jog and it was still achy with referred pain down the left posterior thigh.  The extensions with the belt seemed to irritate me.  Also with achiness in lower back.   Currently in Pain? Yes   Pain Score 2    Pain Location Leg   Pain Orientation Left   Pain Type Acute pain   Aggravating Factors  reaching in the shower and leaning back;  prolonged sitting    Pain Relieving Factors stretching                         [facility] Adult PT Treatment/Exercise - 03/23/17 0001      Lumbar Exercises: Stretches   Active Hamstring Stretch 5 reps;20 seconds   Active Hamstring Stretch Limitations with strap with abduction and adduction bias   Double Knee to Chest Stretch 5 reps   Quadruped Mid Back Stretch Limitations  prayer stretch after multifidi ex   Piriformis Stretch 30 seconds;2 reps   Piriformis Stretch Limitations supine with ball     Lumbar Exercises: Seated   Sit to Stand Limitations neural gliding 10x bil     Lumbar Exercises: Prone   Other Prone Lumbar Exercises pressups x10 reps beginning    Other Prone Lumbar Exercises multifidi series 5x each with 1 pillow per HEP                PT Education - 03/23/17 1013    Education provided Yes   Education Details prone multifidi, neural flossing    Person(s) Educated Patient   Methods Explanation;Demonstration;Handout   Comprehension Verbalized understanding;Returned demonstration          PT Short Term Goals - 02/28/17 2204      PT SHORT TERM GOAL #1   Title The patient will express understanding of basic self care strategies including postural considerations, basic HEP   Time 4   Period Weeks   Status New   Target Date 03/28/17     PT SHORT TERM GOAL #2   Title The patient will report a 40%  improvement in back, hip and thigh symptoms with usual home, work activities   Time 4   Period Weeks   Status New     PT SHORT TERM GOAL #3   Title Lumbar flexion to 55 degrees and HS length to 80 degrees needed for playing golf and other recreational activities   Time 4   Period Weeks   Status New           PT Long Term Goals - 02/28/17 2207      PT LONG TERM GOAL #1   Title The patient will be independent in safe self progression of HEP    Time 8   Period Weeks   Status New   Target Date 04/25/17     PT LONG TERM GOAL #2   Title The patient will report a 75% improvement in pain with usual home,work and recreational activities.   Time 8   Period Weeks   Status New     PT LONG TERM GOAL #3   Title Core strength improved to 5-/5 needed for moderate and vigorous recreational activities   Time 8   Period Weeks   Status New     PT LONG TERM GOAL #4   Title FOTO functional outcome score improved from 46%  limitation to 29% limitation   Time 8   Period Weeks   Status New               Plan - 03/23/17 1057    Clinical Impression Statement The patient complains of increased achiness in his back and left posterior thigh for the last 2 days which he attributes to adding overpressure to extension exercises.  Initial difficulty in activating lumbar multifidi for strengthening but with verbal and tactile cues eventually he is able to activate symmetrically.  Able to perform neural flossing without exacerbation of symptoms.     Rehab Potential Good   PT Frequency 2x / week   PT Duration 8 weeks   PT Treatment/Interventions ADLs/Self Care Home Management;Cryotherapy;Electrical Stimulation;Ultrasound;Moist Heat;Traction;Therapeutic activities;Therapeutic exercise;Neuromuscular re-education;Manual techniques;Dry needling;Taping   PT Next Visit Plan progress trunk stability/endurance; lumbar mobility;  hip mobility;  Likes manual HS stretching STGS 11/6      Patient will benefit from skilled therapeutic intervention in order to improve the following deficits and impairments:  Decreased strength, Decreased range of motion, Postural dysfunction, Pain  Visit Diagnosis: Acute left-sided low back pain with left-sided sciatica  Muscle weakness (generalized)     Problem List Patient Active Problem List   Diagnosis Date Noted  . Dyslipidemia 02/23/2017   Lavinia SharpsStacy Almena Hokenson, PT 03/23/17 5:18 PM Phone: [phone] Fax: [phone]  Vivien PrestoSimpson, Tacoya Altizer C 03/23/2017, 5:18 PM  [facility] [address] [place], [place], [address] Phone: [phone]   Fax:  [phone]  Name: Willie Carpenter MRN: [id number] Date of Birth: 25-Jul-1955

## 2017-03-28 ENCOUNTER — Ambulatory Visit: Payer: BC Managed Care – PPO | Admitting: Family Medicine

## 2017-03-28 ENCOUNTER — Ambulatory Visit: Payer: BC Managed Care – PPO | Admitting: Physical Therapy

## 2017-03-28 ENCOUNTER — Encounter: Payer: Self-pay | Admitting: Family Medicine

## 2017-03-28 VITALS — BP 129/84 | HR 55 | Temp 98.4°F | Ht 69.5 in | Wt 185.0 lb

## 2017-03-28 DIAGNOSIS — M6281 Muscle weakness (generalized): Secondary | ICD-10-CM

## 2017-03-28 DIAGNOSIS — M5442 Lumbago with sciatica, left side: Secondary | ICD-10-CM

## 2017-03-28 MED ORDER — ATORVASTATIN CALCIUM 20 MG PO TABS: 20.0000 mg | ORAL_TABLET | Freq: Every day | ORAL | 3 refills | Status: DC

## 2017-03-28 MED ORDER — SUMATRIPTAN SUCCINATE 100 MG PO TABS: 100.0000 mg | ORAL_TABLET | ORAL | 3 refills | Status: DC | PRN

## 2017-03-28 NOTE — Therapy (Signed)
[facility] [address], [address] [place], [place], [address] Phone: [phone]   Fax:  [phone]  Physical Therapy Treatment  Patient Details  Name: Willie Carpenter MRN: [id number] Date of Birth: 06-Jun-1955 Referring Provider: Dr. Gershon CraneStephen Fry   Encounter Date: 03/28/2017  PT End of Session - 03/28/17 1016    Visit Number  8    Date for PT Re-Evaluation  04/25/17    Authorization Type  BCBS    PT Start Time  0936    PT Stop Time  1015    PT Time Calculation (min)  39 min    Activity Tolerance  Patient tolerated treatment well;No increased pain    Behavior During Therapy  WFL for tasks assessed/performed       Past Medical History:  Diagnosis Date  . Hyperlipidemia   . Postherpetic neuralgia   . Sciatica, left side     Past Surgical History:  Procedure Laterality Date  . COLONOSCOPY  2006   clear, done in [place]   . EPIDURAL BLOCK INJECTION  Feb 2008 and May 2008   lumbar, [place]   . LUMBAR DISC SURGERY  2007   in [place], at L5-S1   . SPINE SURGERY    . TONSILLECTOMY AND ADENOIDECTOMY  1972    There were no vitals filed for this visit.  Subjective Assessment - 03/28/17 0937    Subjective  Pt reports that he continues to have an achy back and some of the pain in the posterior thigh. Pt reports continuing to work on his stretches and nerve glides. He went for a walk with his dog the other day, but by the end of this his Lt leg was fairly achy.     Currently in Pain?  Yes    Pain Score  2     Pain Location  Back    Pain Orientation  Left    Pain Descriptors / Indicators  Aching;Dull    Pain Type  Acute pain    Pain Radiating Towards  Lt posterior thigh    Pain Onset  More than a month ago    Pain Frequency  Intermittent    Aggravating Factors   reaching in the shower, prolonged sitting    Pain Relieving Factors  stretching, nerve glides                       [facility] Adult PT  Treatment/Exercise - 03/28/17 0001      Lumbar Exercises: Stretches   Double Knee to Chest Stretch  -- x10 reps, with inhale/exhale   x10 reps, with inhale/exhale     Lumbar Exercises: Standing   Other Standing Lumbar Exercises  repeated flexion in standing x10 reps,       Lumbar Exercises: Quadruped   Opposite Arm/Leg Raise  Left arm/Right leg;Right arm/Left leg;10 reps;Other (comment) 2nd set with green TB resistance for hip extension   2nd set with green TB resistance for hip extension     Manual Therapy   Joint Mobilization  Lt Lumbar extension SNAG at L4/L5, 3x10 reps pain free with prone press up    Soft tissue mobilization  STM low lumbar paraspinals             PT Education - 03/28/17 1016    Education provided  Yes    Education Details  addition to HEP; difficulties of pinpointing symptoms and specific causes due to pt's changing response  Person(s) Educated  Patient    Methods  Explanation;Verbal cues;Handout    Comprehension  Verbalized understanding;Returned demonstration       PT Short Term Goals - 03/28/17 1323      PT SHORT TERM GOAL #1   Title  The patient will express understanding of basic self care strategies including postural considerations, basic HEP    Time  4    Period  Weeks    Status  Achieved      PT SHORT TERM GOAL #2   Title  The patient will report a 40% improvement in back, hip and thigh symptoms with usual home, work activities    Time  4    Period  Weeks    Status  On-going      PT SHORT TERM GOAL #3   Title  Lumbar flexion to 55 degrees and HS length to 80 degrees needed for playing golf and other recreational activities    Time  4    Period  Weeks    Status  Achieved        PT Long Term Goals - 02/28/17 2207      PT LONG TERM GOAL #1   Title  The patient will be independent in safe self progression of HEP     Time  8    Period  Weeks    Status  New    Target Date  04/25/17      PT LONG TERM GOAL #2   Title  The  patient will report a 75% improvement in pain with usual home,work and recreational activities.    Time  8    Period  Weeks    Status  New      PT LONG TERM GOAL #3   Title  Core strength improved to 5-/5 needed for moderate and vigorous recreational activities    Time  8    Period  Weeks    Status  New      PT LONG TERM GOAL #4   Title  FOTO functional outcome score improved from 46% limitation to 29% limitation    Time  8    Period  Weeks    Status  New            Plan - 03/28/17 1017    Clinical Impression Statement  Pt continues to report dull ache in his low back and referred pain into the posterior lateral thigh. Therapist completed manual techniques during prone pressups which pt was able to complete without increase in pain. Overall, pt continues to have difficulty pinpointing exact positions/activities that exacerbate his symptoms, however he did report decrease in pain to 1 or 2 out of 10 following today's session. Made an addition to his HEP to further progress core stability/strength with pt demonstrating good understanding.      Rehab Potential  Good    PT Frequency  2x / week    PT Duration  8 weeks    PT Treatment/Interventions  ADLs/Self Care Home Management;Cryotherapy;Electrical Stimulation;Ultrasound;Moist Heat;Traction;Therapeutic activities;Therapeutic exercise;Neuromuscular re-education;Manual techniques;Dry needling;Taping    PT Next Visit Plan  progress trunk stability/endurance; lumbar mobility;  hip mobility;  STGS 11/6       Patient will benefit from skilled therapeutic intervention in order to improve the following deficits and impairments:  Decreased strength, Decreased range of motion, Postural dysfunction, Pain  Visit Diagnosis: Acute left-sided low back pain with left-sided sciatica  Muscle weakness (generalized)     Problem List Patient Active  Problem List   Diagnosis Date Noted  . Dyslipidemia 02/23/2017    1:28 PM,03/28/17 Marylyn IshiharaSara  Kiser PT, DPT [facility]  [phone]  [facility] [address], [address] [place], [place], [address] Phone: [phone]   Fax:  [phone]  Name: Willie Carpenter MRN: [id number] Date of Birth: Jan 16, 1956

## 2017-03-28 NOTE — Patient Instructions (Signed)
WE NOW OFFER   [facility] FAST TRACK!!!  SAME DAY Appointments for ACUTE CARE  Such as: Sprains, Injuries, cuts, abrasions, rashes, muscle pain, joint pain, back pain Colds, flu, sore throats, headache, allergies, cough, fever  Ear pain, sinus and eye infections Abdominal pain, nausea, vomiting, diarrhea, upset stomach Animal/insect bites  3 Easy Ways to Schedule: Walk-In Scheduling Call in scheduling Mychart Sign-up: [email or web address]         

## 2017-03-28 NOTE — Progress Notes (Signed)
   Subjective:    Patient ID: Willie Carpenter, male    DOB: 1956-01-15, 61 y.o.   MRN: [id number]  HPI Here to follow up left sided low back pain. We saw him a month ago and referred him to PT. This has made a slight improvement but he still has pain every day that radiates into the left upper leg. No numbness or weakness. He does daily stretches at home. The pain has been present for 2 months now.    Review of Systems  Constitutional: Negative.   Respiratory: Negative.   Cardiovascular: Negative.   Genitourinary: Negative.   Musculoskeletal: Positive for back pain.       Objective:   Physical Exam  Constitutional: He appears well-developed and well-nourished.  Cardiovascular: Normal rate, regular rhythm, normal heart sounds and intact distal pulses.  Pulmonary/Chest: Effort normal and breath sounds normal. No respiratory distress. He has no wheezes. He has no rales.  Musculoskeletal:  Tender in the left lower back and left sciatic notch          Assessment & Plan:  Sciatica, we will set up an MRI scan soon to evaluate further.  Gershon CraneStephen Olia Hinderliter, MD

## 2017-03-28 NOTE — Patient Instructions (Signed)
   CLX - QUADRUPED ALTERNATE ARM AND LEG  Start in a crawl position with the center loops of the CLX on each foot with one loop separation. Attach the end loops to each hand as shown.   While maintaining a straight spine in neutral position, straighten out one leg behind you as you raise your opposite arm over head. Return to starting position and then repeat on the other side.      hold up to 5 sec each side.  With Rt leg and Lt arm do an extra set.  x2 sets each     [facility] [address], [address] [place], [place] [address] Phone # [phone] Fax [phone]

## 2017-03-30 ENCOUNTER — Ambulatory Visit: Payer: BC Managed Care – PPO | Admitting: Physical Therapy

## 2017-03-30 DIAGNOSIS — M5442 Lumbago with sciatica, left side: Secondary | ICD-10-CM

## 2017-03-30 DIAGNOSIS — M6281 Muscle weakness (generalized): Secondary | ICD-10-CM

## 2017-03-30 NOTE — Patient Instructions (Addendum)
  SCIATIC NERVE GLIDE - SEATED - 2  Start by sitting flexed forward at your trunk and head as shown. Next, extend your knee as you extend your spine and head into a straight seated posture. Then return to starting position and repeat.   Hold 3 sec, repeat 2-3x/day up to 10 reps     Bridge  -Lie on your back with knees bent and feet flat. The feet should be hip width apart and the knees close together but not touching/  -while maintaining position, lift the buttocks off the ground until the knees,hips, and shoulders become level.  -make sure hip extension is occurring and not lumbar extension. Make sure both hip bones come up evenly and remain level. Squueze the bottom tightly together to perform this maneuver and hold.  x10 reps each. Make sure the right hip does not drop.    ELASTIC BAND - SIDELYING CLAM - [place]   While lying on your side with your knees bent and an elastic band wrapped around your knees, draw up the top knee while keeping contact of your feet together as shown.   Do not let your pelvis roll back during the lifting movement.     x15 reps        FIRE HYDRANT - QUADRUPED HIP ABDUCTION  Start in a crawl position and raise your leg out to the side as shown. Maintain a straight upper and mid back.    x10 reps each    [facility] [address], [address] [place], [place] [address] Phone # [phone] Fax [phone]

## 2017-03-30 NOTE — Therapy (Addendum)
[facility] [address], [place], [place], [address] Phone: [phone]   Fax:  [phone]  Physical Therapy Treatment/Discharge   Patient Details  Name: Willie Carpenter MRN: [id number] Date of Birth: [birth year] Referring Provider: Dr. Alysia Penna   Encounter Date: 03/30/2017  PT End of Session - 03/30/17 1004    Visit Number  9    Date for PT Re-Evaluation  04/25/17    Authorization Type  BCBS    PT Start Time  0936    PT Stop Time  3419    PT Time Calculation (min)  39 min    Activity Tolerance  Patient tolerated treatment well;No increased pain    Behavior During Therapy  WFL for tasks assessed/performed       Past Medical History:  Diagnosis Date  . Hyperlipidemia   . Postherpetic neuralgia   . Sciatica, left side     Past Surgical History:  Procedure Laterality Date  . COLONOSCOPY  2006   clear, done in [place]   . EPIDURAL BLOCK INJECTION  Feb 2008 and May 2008   lumbar, in [place]   . [place] SURGERY  2007   in [place], at L5-S1   . SPINE SURGERY    . TONSILLECTOMY AND ADENOIDECTOMY  1972    There were no vitals filed for this visit.  Subjective Assessment - 03/30/17 0937    Subjective  Pt reports that things are going well. He is supposed to go have an MRI next week and the following week he will be flying back to [place]. He does think that the nerve flossing seems to help the most.     Currently in Pain?  Yes    Pain Score  2     Pain Location  Leg    Pain Orientation  Left;Posterior    Pain Descriptors / Indicators  Aching;Dull    Pain Type  Acute pain    Pain Radiating Towards  Lt posterior thigh     Pain Onset  More than a month ago    Pain Frequency  Intermittent    Aggravating Factors   Reaching in the shower, prolonged sitting     Pain Relieving Factors  nerve glides                       [facility] Adult PT Treatment/Exercise - 03/30/17 0001       Exercises   Exercises  Other Exercises    Other Exercises   standing hip adductor stretch 2x30 sec each       Lumbar Exercises: Seated   Other Seated Lumbar Exercises  on dyna disc with single leg march and hold 3sec, x15 reps each LE    Other Seated Lumbar Exercises  LLE neural gliding x15 reps       Lumbar Exercises: Supine   Bridge  10 reps;Other (comment) hold with alternating knee extension    hold with alternating knee extension    Other Supine Lumbar Exercises  Lt single leg bridge hold with Rt hip hike/lower x7 reps       Lumbar Exercises: Sidelying   Clam  15 reps;Other (comment) green TB    green TB      Lumbar Exercises: Quadruped   Opposite Arm/Leg Raise  Right arm/Left leg;Left arm/Right leg;15 reps;Other (comment) with UE/LE abduction hold    with UE/LE abduction hold    Other Quadruped Lumbar Exercises  firehydrants x10 reps each              PT Education - 03/30/17 1004    Education provided  Yes    Education Details  updates to [employer]) Educated  Patient    Methods  Explanation    Comprehension  Verbalized understanding       PT Short Term Goals - 03/28/17 1323      PT SHORT TERM GOAL #1   Title  The patient will express understanding of basic self care strategies including postural considerations, basic HEP    Time  4    Period  Weeks    Status  Achieved      PT SHORT TERM GOAL #2   Title  The patient will report a 40% improvement in back, hip and thigh symptoms with usual home, work activities    Time  4    Period  Weeks    Status  On-going      PT SHORT TERM GOAL #3   Title  Lumbar flexion to 55 degrees and HS length to 80 degrees needed for playing golf and other recreational activities    Time  4    Period  Weeks    Status  Achieved        PT Long Term Goals - 02/28/17 2207      PT LONG TERM GOAL #1   Title  The patient will be independent in safe self progression of HEP     Time  8    Period  Weeks    Status  New     Target Date  04/25/17      PT LONG TERM GOAL #2   Title  The patient will report a 75% improvement in pain with usual home,work and recreational activities.    Time  8    Period  Weeks    Status  New      PT LONG TERM GOAL #3   Title  Core strength improved to 5-/5 needed for moderate and vigorous recreational activities    Time  8    Period  Weeks    Status  New      PT LONG TERM GOAL #4   Title  FOTO functional outcome score improved from 46% limitation to 29% limitation    Time  8    Period  Weeks    Status  New            Plan - 03/30/17 1015    Clinical Impression Statement  Pt arrived today, reporting some improvements with the nerve glides he has been completing at home. Session focused on progressing trunk and hip stability this session with noted difficulty activating his Lt hip rotators during bridging progression. Pt continues to be relatively pain free during his sessions, and reported no exacerbation of his symptoms with today's new exercises. Will continue with current POC.      Rehab Potential  Good    PT Frequency  2x / week    PT Duration  8 weeks    PT Treatment/Interventions  ADLs/Self Care Home Management;Cryotherapy;Electrical Stimulation;Ultrasound;Moist Heat;Traction;Therapeutic activities;Therapeutic exercise;Neuromuscular re-education;Manual techniques;Dry needling;Taping    PT Next Visit Plan  progress trunk stability/endurance; lumbar mobility;  hip mobility;         Patient will benefit from skilled therapeutic intervention in order to improve the following deficits and impairments:  Decreased strength, Decreased range of motion, Postural dysfunction, Pain  Visit Diagnosis:  Acute left-sided low back pain with left-sided sciatica  Muscle weakness (generalized)     Problem List Patient Active Problem List   Diagnosis Date Noted  . Dyslipidemia 02/23/2017   10:16 AM,03/30/17 Elly Modena PT, DPT [place] [facility]  [facility] [address] [place], [place], [address] Phone: [phone]   Fax:  [phone]  Name: MEYER ARORA MRN: [id number] Date of Birth: 09-01-1955   PHYSICAL THERAPY DISCHARGE SUMMARY  Visits from Start of Care: 9  Current functional level related to goals / functional outcomes: See above for more details    Remaining deficits: See above for more details    Education / Equipment: See above for more details  Plan: Patient agrees to discharge.  Patient goals were partially met. Patient is being discharged due to being pleased with the current functional level.  ?????    Therapist called and spoke with the pt who reports things are going well. He notes improvements all around and is continuing with his HEP and other instructions provided. He is pleased with his progress and agreeable to d/c at this time.    2:51 PM,05/02/17 Elly Modena PT, [place] [facility] [employer]

## 2017-04-06 ENCOUNTER — Ambulatory Visit
Admission: RE | Admit: 2017-04-06 | Discharge: 2017-04-06 | Disposition: A | Discharge status: Home / Self Care | Payer: BC Managed Care – PPO | Source: Ambulatory Visit | Attending: Family Medicine | Admitting: Family Medicine

## 2017-04-06 ENCOUNTER — Encounter: Payer: BC Managed Care – PPO | Admitting: Family Medicine

## 2017-04-06 DIAGNOSIS — M5442 Lumbago with sciatica, left side: Secondary | ICD-10-CM

## 2017-04-12 NOTE — Addendum Note (Signed)
Addended by: Gershon CraneFRY, Iretta Mangrum A on: 04/12/2017 08:19 AM   Modules accepted: Orders

## 2017-04-19 ENCOUNTER — Encounter: Payer: Self-pay | Admitting: Family Medicine

## 2017-04-19 ENCOUNTER — Encounter: Payer: Self-pay | Admitting: Internal Medicine

## 2017-04-19 ENCOUNTER — Ambulatory Visit (INDEPENDENT_AMBULATORY_CARE_PROVIDER_SITE_OTHER): Payer: BC Managed Care – PPO | Admitting: Family Medicine

## 2017-04-19 VITALS — BP 108/60 | HR 64 | Temp 97.7°F | Ht 70.0 in | Wt 184.2 lb

## 2017-04-19 DIAGNOSIS — Z125 Encounter for screening for malignant neoplasm of prostate: Secondary | ICD-10-CM | POA: Diagnosis not present

## 2017-04-19 DIAGNOSIS — Z0000 Encounter for general adult medical examination without abnormal findings: Secondary | ICD-10-CM

## 2017-04-19 LAB — TSH: TSH: 1.66 u[IU]/mL (ref 0.35–4.50)

## 2017-04-19 LAB — CBC WITH DIFFERENTIAL/PLATELET
BASOS PCT: 1.2 % (ref 0.0–3.0)
Basophils Absolute: 0.1 10*3/uL (ref 0.0–0.1)
EOS PCT: 1.3 % (ref 0.0–5.0)
Eosinophils Absolute: 0.1 10*3/uL (ref 0.0–0.7)
HCT: 41.1 % (ref 39.0–52.0)
HEMOGLOBIN: 14 g/dL (ref 13.0–17.0)
LYMPHS ABS: 1.5 10*3/uL (ref 0.7–4.0)
Lymphocytes Relative: 32 % (ref 12.0–46.0)
MCHC: 34.1 g/dL (ref 30.0–36.0)
MCV: 91.4 fl (ref 78.0–100.0)
MONO ABS: 0.6 10*3/uL (ref 0.1–1.0)
Monocytes Relative: 13.8 % — ABNORMAL HIGH (ref 3.0–12.0)
NEUTROS PCT: 51.7 % (ref 43.0–77.0)
Neutro Abs: 2.4 10*3/uL (ref 1.4–7.7)
Platelets: 257 10*3/uL (ref 150.0–400.0)
RBC: 4.5 Mil/uL (ref 4.22–5.81)
RDW: 13.6 % (ref 11.5–15.5)
WBC: 4.7 10*3/uL (ref 4.0–10.5)

## 2017-04-19 LAB — HEPATIC FUNCTION PANEL
ALT: 19 U/L (ref 0–53)
AST: 21 U/L (ref 0–37)
Albumin: 4.5 g/dL (ref 3.5–5.2)
Alkaline Phosphatase: 38 U/L — ABNORMAL LOW (ref 39–117)
BILIRUBIN DIRECT: 0.1 mg/dL (ref 0.0–0.3)
BILIRUBIN TOTAL: 1 mg/dL (ref 0.2–1.2)
Total Protein: 6.9 g/dL (ref 6.0–8.3)

## 2017-04-19 LAB — BASIC METABOLIC PANEL
BUN: 22 mg/dL (ref 6–23)
CALCIUM: 9.7 mg/dL (ref 8.4–10.5)
CO2: 29 mEq/L (ref 19–32)
Chloride: 102 mEq/L (ref 96–112)
Creatinine, Ser: 0.99 mg/dL (ref 0.40–1.50)
GFR: 82.14 mL/min (ref >60.00)
Glucose, Bld: 104 mg/dL — ABNORMAL HIGH (ref 70–99)
POTASSIUM: 4.4 meq/L (ref 3.5–5.1)
SODIUM: 137 meq/L (ref 135–145)

## 2017-04-19 LAB — PSA: PSA: 0.36 ng/mL (ref 0.10–4.00)

## 2017-04-19 LAB — LIPID PANEL
CHOL/HDL RATIO: 4
CHOLESTEROL: 215 mg/dL — AB (ref 0–200)
HDL: 57.4 mg/dL (ref >39.00)
LDL CALC: 142 mg/dL — AB (ref 0–99)
NONHDL: 157.15
Triglycerides: 75 mg/dL (ref 0.0–149.0)
VLDL: 15 mg/dL (ref 0.0–40.0)

## 2017-04-19 NOTE — Progress Notes (Signed)
   Subjective:    Patient ID: Marlan PalauBen P Sekula, male    DOB: 1955/12/07, 61 y.o.   MRN: [id number]  HPI Here for a well exam. We saw him for low back pain a few weeks ago, and he had an MRI of the LS spine. This should 2 mild bulging discs and we referred him to Neurosurgery. The pain is actually better now. He is working out but tries to avoid aby exercises that would stress the lower back.    Review of Systems  Constitutional: Negative.   HENT: Negative.   Eyes: Negative.   Respiratory: Negative.   Cardiovascular: Negative.   Gastrointestinal: Negative.   Genitourinary: Negative.   Musculoskeletal: Negative.   Skin: Negative.   Neurological: Negative.   Psychiatric/Behavioral: Negative.        Objective:   Physical Exam  Constitutional: He is oriented to person, place, and time. He appears well-developed and well-nourished. No distress.  HENT:  Head: Normocephalic and atraumatic.  Right Ear: External ear normal.  Left Ear: External ear normal.  Nose: Nose normal.  Mouth/Throat: Oropharynx is clear and moist. No oropharyngeal exudate.  Eyes: Conjunctivae and EOM are normal. Pupils are equal, round, and reactive to light. Right eye exhibits no discharge. Left eye exhibits no discharge. No scleral icterus.  Neck: Neck supple. No JVD present. No tracheal deviation present. No thyromegaly present.  Cardiovascular: Normal rate, regular rhythm, normal heart sounds and intact distal pulses. Exam reveals no gallop and no friction rub.  No murmur heard. Pulmonary/Chest: Effort normal and breath sounds normal. No respiratory distress. He has no wheezes. He has no rales. He exhibits no tenderness.  Abdominal: Soft. Bowel sounds are normal. He exhibits no distension and no mass. There is no tenderness. There is no rebound and no guarding.  Genitourinary: Rectum normal, prostate normal and penis normal. Rectal exam shows guaiac negative stool. No penile tenderness.  Musculoskeletal: Normal range  of motion. He exhibits no edema or tenderness.  Lymphadenopathy:    He has no cervical adenopathy.  Neurological: He is alert and oriented to person, place, and time. He has normal reflexes. No cranial nerve deficit. He exhibits normal muscle tone. Coordination normal.  Skin: Skin is warm and dry. No rash noted. He is not diaphoretic. No erythema. No pallor.  Psychiatric: He has a normal mood and affect. His behavior is normal. Judgment and thought content normal.          Assessment & Plan:  Well exam. We discussed diet and exercise. Get fasting labs. Set up a colonoscopy soon. Gershon CraneStephen Koki Buxton, MD

## 2017-05-02 ENCOUNTER — Telehealth: Payer: Self-pay | Admitting: Physical Therapy

## 2017-05-02 NOTE — Telephone Encounter (Signed)
Called pt and spoke with him regarding his status. He states things are going well and he continues to complete his exercises, etc as instructed without difficulty. He is agreeable to d/c from PT at this time being pleased with his progress.   2:42 PM,05/02/17 Marylyn IshiharaSara Kiser PT, DPT [facility]  [phone]

## 2017-06-06 ENCOUNTER — Other Ambulatory Visit: Payer: Self-pay

## 2017-06-06 ENCOUNTER — Ambulatory Visit (AMBULATORY_SURGERY_CENTER): Payer: Self-pay | Admitting: *Deleted

## 2017-06-06 VITALS — Ht 70.0 in | Wt 182.0 lb

## 2017-06-06 DIAGNOSIS — Z1211 Encounter for screening for malignant neoplasm of colon: Secondary | ICD-10-CM

## 2017-06-06 NOTE — Progress Notes (Signed)
No egg or soy allergy known to patient  No issues with past sedation with any surgeries  or procedures, no intubation problems  No diet pills per patient No home 02 use per patient  No blood thinners per patient  Pt denies issues with constipation  No A fib or A flutter  EMMI video sent to pt's e mail  

## 2017-06-20 ENCOUNTER — Encounter: Payer: BC Managed Care – PPO | Admitting: Internal Medicine

## 2017-07-20 ENCOUNTER — Encounter: Payer: Self-pay | Admitting: Internal Medicine

## 2017-08-03 ENCOUNTER — Encounter: Payer: Self-pay | Admitting: Internal Medicine

## 2017-08-03 ENCOUNTER — Ambulatory Visit (AMBULATORY_SURGERY_CENTER): Payer: BC Managed Care – PPO | Admitting: Internal Medicine

## 2017-08-03 ENCOUNTER — Other Ambulatory Visit: Payer: Self-pay

## 2017-08-03 VITALS — BP 109/68 | HR 51 | Temp 98.6°F | Resp 11 | Ht 70.0 in | Wt 184.0 lb

## 2017-08-03 DIAGNOSIS — Z1212 Encounter for screening for malignant neoplasm of rectum: Secondary | ICD-10-CM | POA: Diagnosis not present

## 2017-08-03 DIAGNOSIS — Z1211 Encounter for screening for malignant neoplasm of colon: Secondary | ICD-10-CM | POA: Diagnosis present

## 2017-08-03 HISTORY — PX: COLONOSCOPY: SHX174

## 2017-08-03 MED ORDER — SODIUM CHLORIDE 0.9 % IV SOLN: 500.0000 mL | Freq: Once | INTRAVENOUS | Status: DC

## 2017-08-03 NOTE — Progress Notes (Signed)
Report given to PACU, vss 

## 2017-08-03 NOTE — Op Note (Signed)
[facility] Patient Name: Willie Carpenter Procedure Date: 08/03/2017 11:01 AM MRN: [id number] Endoscopist: Iva Boop , MD Age: 62 Referring MD:  Date of Birth: [birth year] Gender: Male Account #: [id number] Procedure:                Colonoscopy Indications:              Screening for colorectal malignant neoplasm Medicines:                Propofol per Anesthesia, Monitored Anesthesia Care Procedure:                Pre-Anesthesia Assessment:                           - Prior to the procedure, a History and Physical                            was performed, and patient medications and                            allergies were reviewed. The patient's tolerance of                            previous anesthesia was also reviewed. The risks                            and benefits of the procedure and the sedation                            options and risks were discussed with the patient.                            All questions were answered, and informed consent                            was obtained. Prior Anticoagulants: The patient has                            taken no previous anticoagulant or antiplatelet                            agents. ASA Grade Assessment: II - A patient with                            mild systemic disease. After reviewing the risks                            and benefits, the patient was deemed in                            satisfactory condition to undergo the procedure.                           After obtaining informed consent, the colonoscope  was passed under direct vision. Throughout the                            procedure, the patient's blood pressure, pulse, and                            oxygen saturations were monitored continuously. The                            Colonoscope was introduced through the anus and                            advanced to the the cecum, identified by   appendiceal orifice and ileocecal valve. The                            colonoscopy was performed without difficulty. The                            patient tolerated the procedure well. The quality                            of the bowel preparation was excellent. The bowel                            preparation used was Miralax. The ileocecal valve,                            appendiceal orifice, and rectum were photographed. Scope In: 11:18:58 AM Scope Out: 11:30:10 AM Scope Withdrawal Time: 0 hours 7 minutes 59 seconds  Total Procedure Duration: 0 hours 11 minutes 12 seconds  Findings:                 The perianal and digital rectal examinations were                            normal. Pertinent negatives include normal prostate                            (size, shape, and consistency).                           Diverticula were found in the sigmoid colon.                           The exam was otherwise without abnormality on                            direct and retroflexion views. Complications:            No immediate complications. Estimated Blood Loss:     Estimated blood loss: none. Impression:               - Diverticulosis in the sigmoid colon.                           -  The examination was otherwise normal on direct                            and retroflexion views.                           - No specimens collected. Recommendation:           - Patient has a contact number available for                            emergencies. The signs and symptoms of potential                            delayed complications were discussed with the                            patient. Return to normal activities tomorrow.                            Written discharge instructions were provided to the                            patient.                           - Resume previous diet.                           - Continue present medications.                           - Repeat colonoscopy  other appropriate test in 10                            years for screening purposes. Iva Boop, MD 08/03/2017 11:47:20 AM This report has been signed electronically.

## 2017-08-03 NOTE — Patient Instructions (Addendum)
No polyps or cancer seen.  You do have diverticulosis - thickened muscle rings and pouches in the colon wall. Please read the handout about this condition.  Next routine colonoscopy or other screening test in 10 years - 2029  I appreciate the opportunity to care for you. Iva Booparl E. Arlyss Weathersby, MD, FACG YOU HAD AN ENDOSCOPIC PROCEDURE TODAY AT THE [facility]:   Refer to the procedure report that was given to you for any specific questions about what was found during the examination.  If the procedure report does not answer your questions, please call your gastroenterologist to clarify.  If you requested that your care partner not be given the details of your procedure findings, then the procedure report has been included in a sealed envelope for you to review at your convenience later.  YOU SHOULD EXPECT: Some feelings of bloating in the abdomen. Passage of more gas than usual.  Walking can help get rid of the air that was put into your GI tract during the procedure and reduce the bloating. If you had a lower endoscopy (such as a colonoscopy or flexible sigmoidoscopy) you may notice spotting of blood in your stool or on the toilet paper. If you underwent a bowel prep for your procedure, you may not have a normal bowel movement for a few days.  Please Note:  You might notice some irritation and congestion in your nose or some drainage.  This is from the oxygen used during your procedure.  There is no need for concern and it should clear up in a day or so.  SYMPTOMS TO REPORT IMMEDIATELY:   Following lower endoscopy (colonoscopy or flexible sigmoidoscopy):  Excessive amounts of blood in the stool  Significant tenderness or worsening of abdominal pains  Swelling of the abdomen that is new, acute  Fever of 100F or higher  For urgent or emergent issues, a gastroenterologist can be reached at any hour by calling [phone].   DIET:  We do recommend a small meal at first, but  then you may proceed to your regular diet.  Drink plenty of fluids but you should avoid alcoholic beverages for 24 hours.  MEDICATIONS: Continue present medications.  Please see handouts given to you by your recovery nurse.  ACTIVITY:  You should plan to take it easy for the rest of today and you should NOT DRIVE or use heavy machinery until tomorrow (because of the sedation medicines used during the test).    FOLLOW UP: Our staff will call the number listed on your records the next business day following your procedure to check on you and address any questions or concerns that you may have regarding the information given to you following your procedure. If we do not reach you, we will leave a message.  However, if you are feeling well and you are not experiencing any problems, there is no need to return our call.  We will assume that you have returned to your regular daily activities without incident.  If any biopsies were taken you will be contacted by phone or by letter within the next 1-3 weeks.  Please call us at [phone] if you have not heard about the biopsies in 3 weeks.   Thank you for allowing us to provide for your healthcare needs today.  SIGNATURES/CONFIDENTIALITY: You and/or your care partner have signed paperwork which will be entered into your electronic medical record.  These signatures attest to the fact that that the information above on  your After Visit Summary has been reviewed and is understood.  Full responsibility of the confidentiality of this discharge information lies with you and/or your care-partner. 

## 2017-08-04 ENCOUNTER — Telehealth: Payer: Self-pay | Admitting: *Deleted

## 2017-08-04 NOTE — Telephone Encounter (Signed)
No answer, left message to call if questions or concerns. 

## 2017-08-04 NOTE — Telephone Encounter (Signed)
No answer. Number identifier. Message left to call if questions or concerns. 

## 2017-09-28 ENCOUNTER — Ambulatory Visit: Payer: BC Managed Care – PPO | Admitting: Family Medicine

## 2017-09-28 ENCOUNTER — Encounter: Payer: Self-pay | Admitting: Family Medicine

## 2017-09-28 VITALS — BP 98/70 | HR 76 | Temp 98.0°F | Ht 70.0 in | Wt 182.0 lb

## 2017-09-28 DIAGNOSIS — R739 Hyperglycemia, unspecified: Secondary | ICD-10-CM | POA: Diagnosis not present

## 2017-09-28 DIAGNOSIS — L989 Disorder of the skin and subcutaneous tissue, unspecified: Secondary | ICD-10-CM

## 2017-09-28 DIAGNOSIS — Z9109 Other allergy status, other than to drugs and biological substances: Secondary | ICD-10-CM

## 2017-09-28 DIAGNOSIS — Z209 Contact with and (suspected) exposure to unspecified communicable disease: Secondary | ICD-10-CM

## 2017-09-28 DIAGNOSIS — M19031 Primary osteoarthritis, right wrist: Secondary | ICD-10-CM | POA: Diagnosis not present

## 2017-09-28 DIAGNOSIS — E785 Hyperlipidemia, unspecified: Secondary | ICD-10-CM

## 2017-09-28 MED ORDER — LORATADINE-PSEUDOEPHEDRINE ER 10-240 MG PO TB24: 1.0000 | ORAL_TABLET | Freq: Every day | ORAL | 3 refills | Status: DC

## 2017-09-28 NOTE — Progress Notes (Signed)
   Subjective:    Patient ID: Willie Carpenter, male    DOB: 09-May-1956, 62 y.o.   MRN: [id number]  HPI Here to follow up and he has some questions. He would like to see Dermatology for a general skin check. He has had swelling and pain in the right wrist for about 2 months. His other joints sometimes have mild pain that comes and goes. His allergies have been acting up and he wants a rx for Claritin D.    Review of Systems  Constitutional: Negative.   HENT: Positive for congestion, postnasal drip, rhinorrhea and sneezing. Negative for sinus pain and sore throat.   Eyes: Negative.   Respiratory: Negative.   Cardiovascular: Negative.   Musculoskeletal: Positive for arthralgias.       Objective:   Physical Exam  Constitutional: He appears well-developed and well-nourished.  HENT:  Right Ear: External ear normal.  Left Ear: External ear normal.  Nose: Nose normal.  Mouth/Throat: Oropharynx is clear and moist.  Eyes: Conjunctivae are normal.  Neck: No thyromegaly present.  Cardiovascular: Normal rate, regular rhythm, normal heart sounds and intact distal pulses.  Pulmonary/Chest: Effort normal and breath sounds normal.  Musculoskeletal:  The right wrist shows no swelling or erythema. This is tender at the dorsal side of the radial wrist. ROM is full.   Lymphadenopathy:    He has no cervical adenopathy.          Assessment & Plan:  We will refer him to Dermatology for a skin check. Refilled Claritin D for allergies. 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 He can use Ibuprofen prn.  Gershon Crane, MD

## 2017-10-18 ENCOUNTER — Other Ambulatory Visit (INDEPENDENT_AMBULATORY_CARE_PROVIDER_SITE_OTHER): Payer: BC Managed Care – PPO

## 2017-10-18 DIAGNOSIS — R739 Hyperglycemia, unspecified: Secondary | ICD-10-CM | POA: Diagnosis not present

## 2017-10-18 DIAGNOSIS — E785 Hyperlipidemia, unspecified: Secondary | ICD-10-CM | POA: Diagnosis not present

## 2017-10-18 DIAGNOSIS — Z209 Contact with and (suspected) exposure to unspecified communicable disease: Secondary | ICD-10-CM

## 2017-10-18 LAB — HEMOGLOBIN A1C: HEMOGLOBIN A1C: 5.8 % (ref 4.6–6.5)

## 2017-10-18 LAB — LIPID PANEL
Cholesterol: 194 mg/dL (ref 0–200)
HDL: 65.4 mg/dL (ref >39.00)
LDL Cholesterol: 113 mg/dL — ABNORMAL HIGH (ref 0–99)
NONHDL: 128.24
Total CHOL/HDL Ratio: 3
Triglycerides: 75 mg/dL (ref 0.0–149.0)
VLDL: 15 mg/dL (ref 0.0–40.0)

## 2017-10-19 LAB — MEASLES/MUMPS/RUBELLA IMMUNITY
Mumps IgG: 128 AU/mL
RUBELLA: 1.03 {index}
Rubeola IgG: >300 AU/mL

## 2018-03-05 ENCOUNTER — Telehealth: Payer: Self-pay | Admitting: *Deleted

## 2018-03-05 DIAGNOSIS — L989 Disorder of the skin and subcutaneous tissue, unspecified: Secondary | ICD-10-CM

## 2018-03-05 NOTE — Telephone Encounter (Signed)
Patient is calling about his wife- he also request Dr Clent Ridges call him about referral to dermatology . He would like to speak to him personally.

## 2018-03-05 NOTE — Telephone Encounter (Signed)
Dr. Fry please advise. Thanks  

## 2018-03-06 NOTE — Telephone Encounter (Signed)
[id number] x 1   Need to know the reason for the referral to dermatology.

## 2018-03-06 NOTE — Telephone Encounter (Signed)
I would be happy to refer him to Dermatology, but I need to know the reason. Please ask him for more details

## 2018-03-09 NOTE — Telephone Encounter (Signed)
Pt says that he had an apt in May and he and the  provider discussed a referral to dermatology. Pt says that he's not sure what happened but he never heard anything back about referral so pt is just following up. Pt asked if provider could look back in his notes.

## 2018-03-09 NOTE — Addendum Note (Signed)
Addended by: Gershon Crane A on: 03/09/2018 04:40 PM   Modules accepted: Orders

## 2018-03-09 NOTE — Telephone Encounter (Signed)
The referral was done  

## 2018-03-09 NOTE — Telephone Encounter (Signed)
Dr. Clent Ridges this would be for a general skin check.  Please advise. Thanks

## 2018-03-21 ENCOUNTER — Encounter: Payer: Self-pay | Admitting: Family Medicine

## 2018-03-21 ENCOUNTER — Ambulatory Visit: Payer: BC Managed Care – PPO | Admitting: Family Medicine

## 2018-03-21 VITALS — BP 116/74 | HR 74 | Temp 98.3°F | Wt 184.0 lb

## 2018-03-21 DIAGNOSIS — S0181XA Laceration without foreign body of other part of head, initial encounter: Secondary | ICD-10-CM

## 2018-03-21 DIAGNOSIS — K219 Gastro-esophageal reflux disease without esophagitis: Secondary | ICD-10-CM | POA: Diagnosis not present

## 2018-03-21 DIAGNOSIS — N138 Other obstructive and reflux uropathy: Secondary | ICD-10-CM

## 2018-03-21 DIAGNOSIS — N401 Enlarged prostate with lower urinary tract symptoms: Secondary | ICD-10-CM | POA: Diagnosis not present

## 2018-03-21 DIAGNOSIS — L659 Nonscarring hair loss, unspecified: Secondary | ICD-10-CM | POA: Diagnosis not present

## 2018-03-21 MED ORDER — SUMATRIPTAN SUCCINATE 100 MG PO TABS: 100.0000 mg | ORAL_TABLET | ORAL | 5 refills | Status: DC | PRN

## 2018-03-21 MED ORDER — FAMOTIDINE 20 MG PO TABS: 20.0000 mg | ORAL_TABLET | Freq: Two times a day (BID) | ORAL | 5 refills | Status: DC

## 2018-03-21 MED ORDER — ATORVASTATIN CALCIUM 20 MG PO TABS: 20.0000 mg | ORAL_TABLET | Freq: Every day | ORAL | 3 refills | Status: DC

## 2018-03-21 NOTE — Progress Notes (Signed)
   Subjective:    Patient ID: Willie Carpenter, male    DOB: [birth year], 62 y.o.   MRN: [id number]  HPI Here for a number of issues. First he asks about thinning hair. This has been more noticeable in the past 3 months. Second he struck the left side of his head with a car door a week ago and he wants me to check the site. It is still sore but feels much better. Third asks about a medication for GERD to replace the Zantac he had been taking before this was recalled. He took a 150 mg tablet several times a week depending on his diet. Fourth he has had to get up to urinate at night more often, and now he averages 4-5 times a night. There is no urgency or discomfort.    Review of Systems  Constitutional: Negative.   HENT: Negative for ear pain and facial swelling.   Respiratory: Negative.   Cardiovascular: Negative.   Gastrointestinal: Positive for abdominal pain. Negative for nausea and vomiting.  Genitourinary: Positive for frequency. Negative for difficulty urinating, dysuria and urgency.  Skin: Positive for wound.       Objective:   Physical Exam  Constitutional: He appears well-developed and well-nourished.  HENT:  Right Ear: External ear normal.  Left Ear: External ear normal.  Nose: Nose normal.  Mouth/Throat: Oropharynx is clear and moist.  There is a healing laceration just anterior to the left ear lobe which is 2 cm long. No signs of infection. No tenderness   Cardiovascular: Normal rate, regular rhythm, normal heart sounds and intact distal pulses.  Pulmonary/Chest: Effort normal and breath sounds normal.  Abdominal: Soft. Bowel sounds are normal. He exhibits no distension and no mass. There is no tenderness. There is no rebound and no guarding.  Skin:  He has typical male pattern thinning of the hair on the scalp vertex and the temples           Assessment & Plan:  For hair thinning, he will try Rogaine shampoo. For BPH symptoms, try saw palmetto OTC. For GERD, try  Famotidine 20 mg bid prn. The cheek laceration is healing nicely. He can apply Neosporin bid as needed.  Gershon Crane, MD

## 2018-03-27 ENCOUNTER — Encounter: Payer: Self-pay | Admitting: Family Medicine

## 2018-03-27 ENCOUNTER — Ambulatory Visit: Payer: BC Managed Care – PPO | Admitting: Family Medicine

## 2018-03-27 VITALS — BP 118/72 | HR 72 | Temp 97.7°F | Wt 184.0 lb

## 2018-03-27 DIAGNOSIS — N401 Enlarged prostate with lower urinary tract symptoms: Secondary | ICD-10-CM

## 2018-03-27 DIAGNOSIS — N138 Other obstructive and reflux uropathy: Secondary | ICD-10-CM | POA: Insufficient documentation

## 2018-03-27 MED ORDER — TAMSULOSIN HCL 0.4 MG PO CAPS: 0.4000 mg | ORAL_CAPSULE | Freq: Every day | ORAL | 3 refills | Status: DC

## 2018-03-27 NOTE — Progress Notes (Signed)
   Subjective:    Patient ID: Willie Carpenter, male    DOB: 02/22/1956, 62 y.o.   MRN: [id number]  HPI  Here to discuss his nocturia and urinary frequency. He gets up 4-5 times at night to urinate. At our recent visit he decided to try OTC saw palmetto, but he has changed his mind. Now he wants to try a prescription medication.   Review of Systems  Constitutional: Negative.   Respiratory: Negative.   Cardiovascular: Negative.   Genitourinary: Positive for frequency. Negative for difficulty urinating, dysuria, flank pain, hematuria and urgency.       Objective:   Physical Exam  Constitutional: He is oriented to person, place, and time. He appears well-developed and well-nourished.  Cardiovascular: Normal rate, regular rhythm, normal heart sounds and intact distal pulses.  Pulmonary/Chest: Effort normal and breath sounds normal.  Neurological: He is alert and oriented to person, place, and time.          Assessment & Plan:  BPH with nocturia. Try Flomax 0.4 mg daily.  Gershon Crane, MD

## 2018-04-13 ENCOUNTER — Other Ambulatory Visit: Payer: Self-pay | Admitting: Family Medicine

## 2018-04-24 ENCOUNTER — Ambulatory Visit (INDEPENDENT_AMBULATORY_CARE_PROVIDER_SITE_OTHER): Payer: BC Managed Care – PPO | Admitting: Family Medicine

## 2018-04-24 ENCOUNTER — Encounter: Payer: Self-pay | Admitting: Family Medicine

## 2018-04-24 VITALS — BP 120/82 | HR 75 | Temp 98.4°F | Ht 69.0 in | Wt 185.0 lb

## 2018-04-24 DIAGNOSIS — Z0000 Encounter for general adult medical examination without abnormal findings: Secondary | ICD-10-CM

## 2018-04-24 MED ORDER — TAMSULOSIN HCL 0.4 MG PO CAPS: 0.8000 mg | ORAL_CAPSULE | Freq: Every day | ORAL | 3 refills | Status: DC

## 2018-04-24 MED ORDER — FAMOTIDINE 40 MG PO TABS: 40.0000 mg | ORAL_TABLET | Freq: Every day | ORAL | 3 refills | Status: DC

## 2018-04-24 NOTE — Progress Notes (Signed)
   Subjective:    Patient ID: Willie Carpenter, male    DOB: Aug 19, 1955, 62 y.o.   MRN: [id number]  HPI Here for a well exam. He has had improvement in his urine flow and his nocturia has been reduced by taking 0.4 mg of Flomax daily. However he asks if the dose can be increased further. His GERD has been hellped by taking 20 mg of Pepcid daily but he asks to increase the dose of this as well. Otherwise he is doing well.   Review of Systems  Constitutional: Negative.   HENT: Negative.   Eyes: Negative.   Respiratory: Negative.   Cardiovascular: Negative.   Gastrointestinal: Negative.   Genitourinary: Positive for frequency.  Musculoskeletal: Negative.   Skin: Negative.   Neurological: Negative.   Psychiatric/Behavioral: Negative.        Objective:   Physical Exam  Constitutional: He is oriented to person, place, and time. He appears well-developed and well-nourished. No distress.  HENT:  Head: Normocephalic and atraumatic.  Right Ear: External ear normal.  Left Ear: External ear normal.  Nose: Nose normal.  Mouth/Throat: Oropharynx is clear and moist. No oropharyngeal exudate.  Eyes: Pupils are equal, round, and reactive to light. Conjunctivae and EOM are normal. Right eye exhibits no discharge. Left eye exhibits no discharge. No scleral icterus.  Neck: Neck supple. No JVD present. No tracheal deviation present. No thyromegaly present.  Cardiovascular: Normal rate, regular rhythm, normal heart sounds and intact distal pulses. Exam reveals no gallop and no friction rub.  No murmur heard. Pulmonary/Chest: Effort normal and breath sounds normal. No respiratory distress. He has no wheezes. He has no rales. He exhibits no tenderness.  Abdominal: Soft. Bowel sounds are normal. He exhibits no distension and no mass. There is no tenderness. There is no rebound and no guarding.  Genitourinary: Rectum normal, prostate normal and penis normal. Rectal exam shows guaiac negative stool. No penile  tenderness.  Musculoskeletal: Normal range of motion. He exhibits no edema or tenderness.  Lymphadenopathy:    He has no cervical adenopathy.  Neurological: He is alert and oriented to person, place, and time. He has normal reflexes. He displays normal reflexes. No cranial nerve deficit. He exhibits normal muscle tone. Coordination normal.  Skin: Skin is warm and dry. No rash noted. He is not diaphoretic. No erythema. No pallor.  Psychiatric: He has a normal mood and affect. His behavior is normal. Judgment and thought content normal.          Assessment & Plan:  Well exam. We discussed diet and exercise. Set up fasting labs soon. Increase Pepcid to 40 mg daily and increase Flomax to 0.8 mg daily. Gershon CraneStephen Lizzie Cokley, MD

## 2018-04-26 ENCOUNTER — Other Ambulatory Visit (INDEPENDENT_AMBULATORY_CARE_PROVIDER_SITE_OTHER): Payer: BC Managed Care – PPO

## 2018-04-26 DIAGNOSIS — Z0000 Encounter for general adult medical examination without abnormal findings: Secondary | ICD-10-CM | POA: Diagnosis not present

## 2018-04-26 LAB — POC URINALSYSI DIPSTICK (AUTOMATED)
Bilirubin, UA: NEGATIVE
Glucose, UA: NEGATIVE
Ketones, UA: NEGATIVE
Leukocytes, UA: NEGATIVE
Nitrite, UA: NEGATIVE
Protein, UA: NEGATIVE
RBC UA: POSITIVE
Spec Grav, UA: 1.025 (ref 1.010–1.025)
Urobilinogen, UA: 0.2 E.U./dL
pH, UA: 6 (ref 5.0–8.0)

## 2018-04-26 LAB — HEPATIC FUNCTION PANEL
ALT: 26 U/L (ref 0–53)
AST: 23 U/L (ref 0–37)
Albumin: 4.4 g/dL (ref 3.5–5.2)
Alkaline Phosphatase: 34 U/L — ABNORMAL LOW (ref 39–117)
Bilirubin, Direct: 0.1 mg/dL (ref 0.0–0.3)
Total Bilirubin: 0.7 mg/dL (ref 0.2–1.2)
Total Protein: 6.7 g/dL (ref 6.0–8.3)

## 2018-04-26 LAB — CBC WITH DIFFERENTIAL/PLATELET
Basophils Absolute: 0 10*3/uL (ref 0.0–0.1)
Basophils Relative: 1 % (ref 0.0–3.0)
Eosinophils Absolute: 0 10*3/uL (ref 0.0–0.7)
Eosinophils Relative: 1.1 % (ref 0.0–5.0)
HCT: 40.5 % (ref 39.0–52.0)
Hemoglobin: 13.9 g/dL (ref 13.0–17.0)
Lymphocytes Relative: 27.3 % (ref 12.0–46.0)
Lymphs Abs: 1 10*3/uL (ref 0.7–4.0)
MCHC: 34.3 g/dL (ref 30.0–36.0)
MCV: 91.4 fl (ref 78.0–100.0)
Monocytes Absolute: 0.5 10*3/uL (ref 0.1–1.0)
Monocytes Relative: 13.3 % — ABNORMAL HIGH (ref 3.0–12.0)
Neutro Abs: 2.1 10*3/uL (ref 1.4–7.7)
Neutrophils Relative %: 57.3 % (ref 43.0–77.0)
PLATELETS: 273 10*3/uL (ref 150.0–400.0)
RBC: 4.43 Mil/uL (ref 4.22–5.81)
RDW: 12.7 % (ref 11.5–15.5)
WBC: 3.7 10*3/uL — ABNORMAL LOW (ref 4.0–10.5)

## 2018-04-26 LAB — LIPID PANEL
Cholesterol: 185 mg/dL (ref 0–200)
HDL: 60.7 mg/dL (ref >39.00)
LDL Cholesterol: 112 mg/dL — ABNORMAL HIGH (ref 0–99)
NonHDL: 124.71
TRIGLYCERIDES: 65 mg/dL (ref 0.0–149.0)
Total CHOL/HDL Ratio: 3
VLDL: 13 mg/dL (ref 0.0–40.0)

## 2018-04-26 LAB — BASIC METABOLIC PANEL
BUN: 20 mg/dL (ref 6–23)
CO2: 28 mEq/L (ref 19–32)
Calcium: 9.3 mg/dL (ref 8.4–10.5)
Chloride: 102 mEq/L (ref 96–112)
Creatinine, Ser: 0.88 mg/dL (ref 0.40–1.50)
GFR: 93.77 mL/min (ref >60.00)
Glucose, Bld: 115 mg/dL — ABNORMAL HIGH (ref 70–99)
POTASSIUM: 4.5 meq/L (ref 3.5–5.1)
Sodium: 136 mEq/L (ref 135–145)

## 2018-04-26 LAB — TSH: TSH: 1.34 u[IU]/mL (ref 0.35–4.50)

## 2018-04-26 LAB — PSA: PSA: 0.23 ng/mL (ref 0.10–4.00)

## 2018-05-02 ENCOUNTER — Encounter: Payer: Self-pay | Admitting: *Deleted

## 2018-11-23 ENCOUNTER — Ambulatory Visit: Payer: Self-pay

## 2018-11-23 NOTE — Telephone Encounter (Signed)
Pt. Called to request COVID testing for him and his family, due to plan to have his Father-in-law stay at their home.  Reported his father-in-law is considered high-risk with his health issues.  The pt. Denied any COVID symptoms.  Denied known exposure to COVID.  Has traveled recently to [facility] for weeks's vacation, 6/21-6/28.  Advised will send message to PCP to give recommendations on testing.  Agreed with plan.   Reason for Disposition . [1] COVID-19 EXPOSURE (Close Contact) AND [2] within last 14 days BUT [3] NO symptoms  Answer Assessment - Initial Assessment Questions 1. CLOSE CONTACT: "Who is the person with the confirmed or suspected COVID-19 infection that you were exposed to?"     n/a 2. PLACE of CONTACT: "Where were you when you were exposed to COVID-19?" (e.g., home, school, medical waiting room; which city?)     Vacationed at [facility] recently 6/21-6/28 3. TYPE of CONTACT: "How much contact was there?" (e.g., sitting next to, live in same house, work in same office, same building)     No specific contact 4. DURATION of CONTACT: "How long were you in contact with the COVID-19 patient?" (e.g., a few seconds, passed by person, a few minutes, live with the patient)     *No Answer* 5. DATE of CONTACT: "When did you have contact with a COVID-19 patient?" (e.g., how many days ago)     *No Answer* 6. TRAVEL: "Have you traveled out of the country recently?" If so, "When and where?"     * Also ask about out-of-state travel, since the CDC has identified some high-risk cities for community spread in the [place].     * Note: Travel becomes less relevant if there is widespread community transmission where the patient lives.     Traveled to [place], [place]; [place], [place] 6[place]21-6/28 7. COMMUNITY SPREAD: "Are there lots of cases of COVID-19 (community spread) where you live?" (See public health department website, if unsure)       Present in community 8. SYMPTOMS: "Do you have any symptoms?"  (e.g., fever, cough, breathing difficulty)     Denied symptoms 9. PREGNANCY OR POSTPARTUM: "Is there any chance you are pregnant?" "When was your last menstrual period?" "Did you deliver in the last 2 weeks?"    N/a  10. HIGH RISK: "Do you have any heart or lung problems? Do you have a weak immune system?" (e.g., CHF, COPD, asthma, HIV positive, chemotherapy, renal failure, diabetes mellitus, sickle cell anemia)     no  Protocols used: CORONAVIRUS (COVID-19) EXPOSURE-A-AH

## 2018-11-26 NOTE — Telephone Encounter (Signed)
Pt has been scheduled for 11/27/2018 at 1:30

## 2018-11-26 NOTE — Telephone Encounter (Signed)
Please schedule for virtual visit to discuss testing.

## 2018-11-27 ENCOUNTER — Encounter: Payer: Self-pay | Admitting: Family Medicine

## 2018-11-27 ENCOUNTER — Other Ambulatory Visit: Payer: Self-pay

## 2018-11-27 ENCOUNTER — Ambulatory Visit (INDEPENDENT_AMBULATORY_CARE_PROVIDER_SITE_OTHER): Payer: BC Managed Care – PPO | Admitting: Family Medicine

## 2018-11-27 DIAGNOSIS — Z209 Contact with and (suspected) exposure to unspecified communicable disease: Secondary | ICD-10-CM

## 2018-11-27 DIAGNOSIS — J3089 Other allergic rhinitis: Secondary | ICD-10-CM | POA: Insufficient documentation

## 2018-11-27 DIAGNOSIS — Z20828 Contact with and (suspected) exposure to other viral communicable diseases: Secondary | ICD-10-CM

## 2018-11-27 DIAGNOSIS — G43909 Migraine, unspecified, not intractable, without status migrainosus: Secondary | ICD-10-CM

## 2018-11-27 NOTE — Progress Notes (Signed)
Subjective:    Patient ID: Willie Carpenter, male    DOB: 10-17-1955, 63 y.o.   MRN: [id number]  HPI Virtual Visit via Video Note  I connected with the patient on 11/27/18 at  1:30 PM EDT by a video enabled telemedicine application and verified that I am speaking with the correct person using two identifiers.  Location patient: home Location provider:work or home office Persons participating in the virtual visit: patient, provider  I discussed the limitations of evaluation and management by telemedicine and the availability of in person appointments. The patient expressed understanding and agreed to proceed.   HPI: Here with several concerns. First he asks for some refills. The Sumatriptan works well for him. He has migraines about twice a year. His allergies have been acting up lately with sneezing and stuffy head. No fever or cough or SOB or chest pain or body aches or NVD. Lastly he asks about testing for the Covid-19 virus. His 502 year old father-in-law will be coming to live with them soon, and he has many health issues. He takes an immunosuppressive medication. The family all wants to be tested for the virus before he moves in.    ROS: See pertinent positives and negatives per HPI.  Past Medical History:  Diagnosis Date  . Allergy   . GERD (gastroesophageal reflux disease)    diet related- PRN zantac OTC  . Hyperlipidemia   . Postherpetic neuralgia   . Sciatica, left side     Past Surgical History:  Procedure Laterality Date  . COLONOSCOPY  08/03/2017   per Dr. Leone PayorGessner, sigmoid diverticulosis, no polyps, repeat in 10 yrs   . EPIDURAL BLOCK INJECTION  Feb 2008 and May 2008   lumbar, in [place]   . KNEE SURGERY     key hole surgery 2016  . LUMBAR DISC SURGERY  2007   in [place], at L5-S1   . RHINOPLASTY    . SPINE SURGERY    . TONSILLECTOMY AND ADENOIDECTOMY  1972    Family History  Problem Relation Age of Onset  . Arthritis Mother   . Arthritis Father   .  Heart disease Father   . Hypertension Father   . Hyperlipidemia Father   . Hyperlipidemia Brother   . Heart disease Brother   . Colon cancer Neg Hx   . Colon polyps Neg Hx   . Rectal cancer Neg Hx   . Stomach cancer Neg Hx      Current Outpatient Medications:  .  atorvastatin (LIPITOR) 20 MG tablet, Take 1 tablet (20 mg total) by mouth daily., Disp: 90 tablet, Rfl: 3 .  cetirizine (ZYRTEC) 10 MG tablet, Take 10 mg by mouth daily., Disp: , Rfl:  .  famotidine (PEPCID) 40 MG tablet, Take 1 tablet (40 mg total) by mouth daily., Disp: 90 tablet, Rfl: 3 .  glucosamine-chondroitin 500-400 MG tablet, Take 1 tablet by mouth daily., Disp: , Rfl:  .  loratadine-pseudoephedrine (CLARITIN-D 24 HOUR) 10-240 MG 24 hr tablet, Take 1 tablet by mouth daily., Disp: 180 tablet, Rfl: 3 .  SUMAtriptan (IMITREX) 100 MG tablet, Take 1 tablet (100 mg total) by mouth every 2 (two) hours as needed for migraine. May repeat in 2 hours if headache persists or recurs., Disp: 30 tablet, Rfl: 5 .  tamsulosin (FLOMAX) 0.4 MG CAPS capsule, Take 2 capsules (0.8 mg total) by mouth daily., Disp: 180 capsule, Rfl: 3 .  Turmeric 500 MG CAPS, Take by mouth., Disp: , Rfl:  Current Facility-Administered Medications:  .  0.9 %  sodium chloride infusion, 500 mL, Intravenous, Once, Gatha Mayer, MD  EXAM:  VITALS per patient if applicable:  GENERAL: alert, oriented, appears well and in no acute distress  HEENT: atraumatic, conjunttiva clear, no obvious abnormalities on inspection of external nose and ears  NECK: normal movements of the head and neck  LUNGS: on inspection no signs of respiratory distress, breathing rate appears normal, no obvious gross SOB, gasping or wheezing  CV: no obvious cyanosis  MS: moves all visible extremities without noticeable abnormality  PSYCH/NEURO: pleasant and cooperative, no obvious depression or anxiety, speech and thought processing grossly intact  ASSESSMENT AND PLAN: His  migraines are stable. For the allergies, we will refill Claritin D to use prn. We will also arrange for him to be tested for the Covid virus. Alysia Penna, MD  Discussed the following assessment and plan:  No diagnosis found.     I discussed the assessment and treatment plan with the patient. The patient was provided an opportunity to ask questions and all were answered. The patient agreed with the plan and demonstrated an understanding of the instructions.   The patient was advised to call back or seek an in-person evaluation if the symptoms worsen or if the condition fails to improve as anticipated.     Review of Systems     Objective:   Physical Exam        Assessment & Plan:

## 2018-11-28 ENCOUNTER — Other Ambulatory Visit: Payer: Self-pay

## 2018-11-28 ENCOUNTER — Telehealth: Payer: Self-pay | Admitting: *Deleted

## 2018-11-28 DIAGNOSIS — Z20822 Contact with and (suspected) exposure to covid-19: Secondary | ICD-10-CM

## 2018-11-28 NOTE — Telephone Encounter (Signed)
-----   Message from Elie Confer, [place] sent at 11/27/2018  2:23 PM EDT ----- Good afternoon!! Dr. Sarajane Jews would like to have this patient tested for covid 19.  He has no symptoms at all but his father in law is elderly and is immunocompromised and will be moving in with his family and would like to make sure they are safe. Thank you

## 2018-11-28 NOTE — Telephone Encounter (Signed)
Pt and wife scheduled for covid testing today @ [facility] @ 10:00. Instructions given and order placed

## 2018-12-03 LAB — NOVEL CORONAVIRUS, NAA: SARS-CoV-2, NAA: NOT DETECTED

## 2019-02-20 ENCOUNTER — Other Ambulatory Visit: Payer: Self-pay | Admitting: Family Medicine

## 2019-02-28 ENCOUNTER — Other Ambulatory Visit: Payer: Self-pay | Admitting: Family Medicine

## 2019-03-30 ENCOUNTER — Encounter: Payer: Self-pay | Admitting: Family Medicine

## 2019-03-30 IMAGING — MR MR LUMBAR SPINE W/O CM
4 of 5 series · 18 of 48 positions shown · non-contrast
Comparison: None.

CLINICAL DATA: Initial evaluation for low back pain with left
buttock and leg pain for 2 months.

EXAM:
MRI LUMBAR SPINE WITHOUT CONTRAST
TECHNIQUE: Multiplanar, multisequence MR imaging of the lumbar spine was
performed. No intravenous contrast was administered.

[Series 10: T2 · sagittal · 4.0mm · 0.78mm/px · 6 of 13 slices shown (1 of 2)]
[im 1/13]
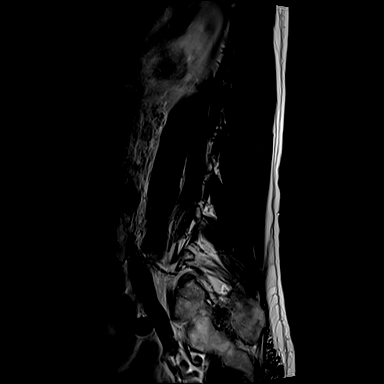
[im 3/13]
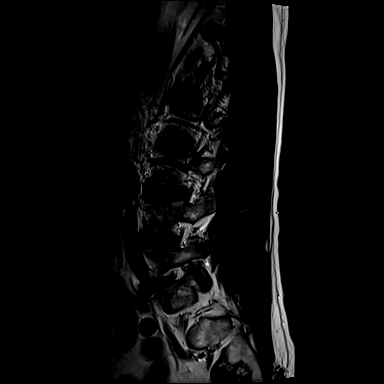
[im 5/13]
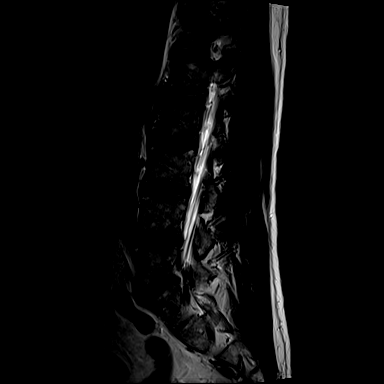
[im 8/13]
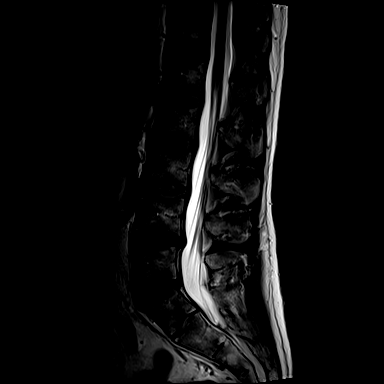
[im 10/13]
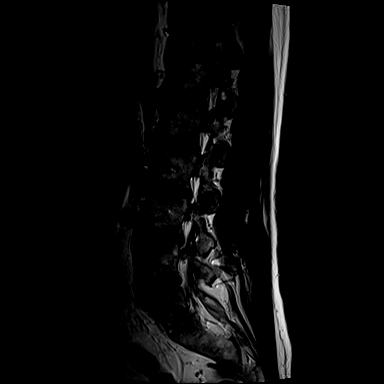
[im 13/13]
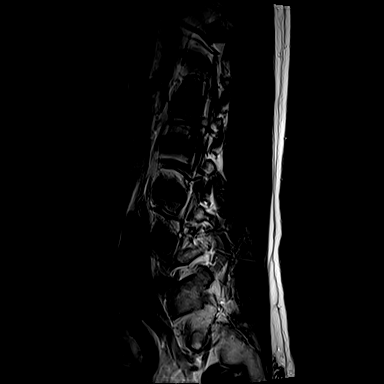

[Series 11: T1 · sagittal · 4.0mm · 0.78mm/px · 3 of 13 slices shown (1 of 2)]
[im 1/13]
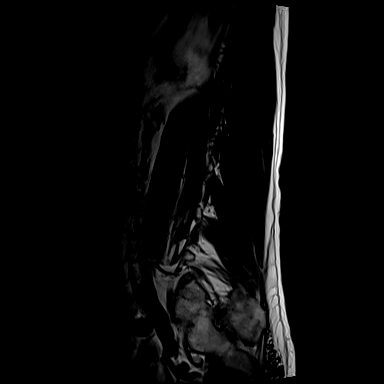
[im 7/13]
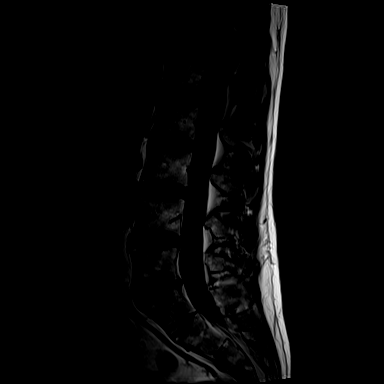
[im 13/13]
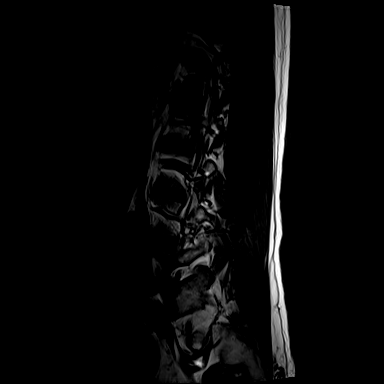

[Series 15: T1 · axial · 4.0mm · 0.28mm/px · z∈[-59,+107]mm · 3 of 40 slices shown (2 of 2)]
[im 6/40]
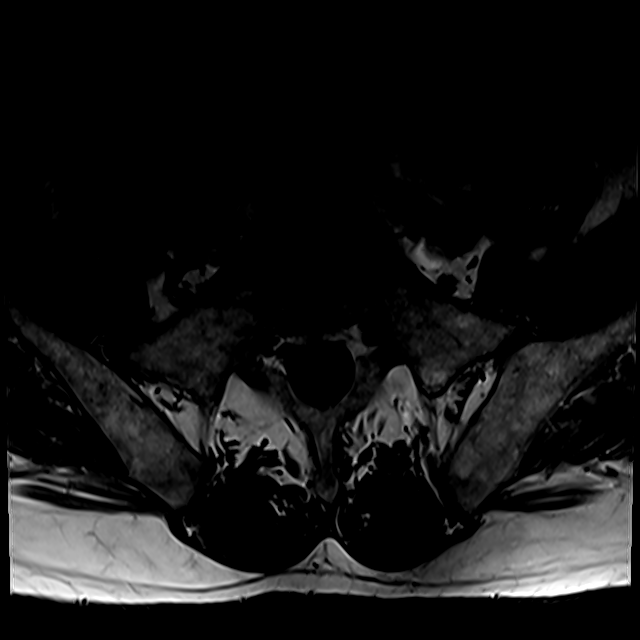
[im 21/40]
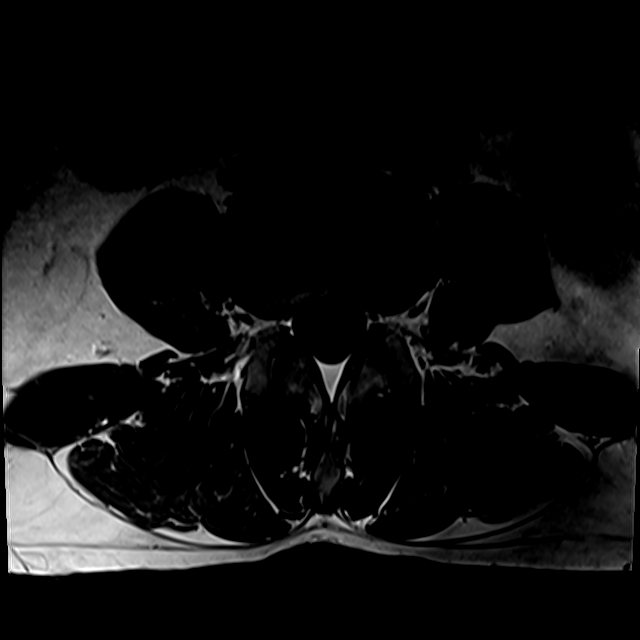
[im 34/40]
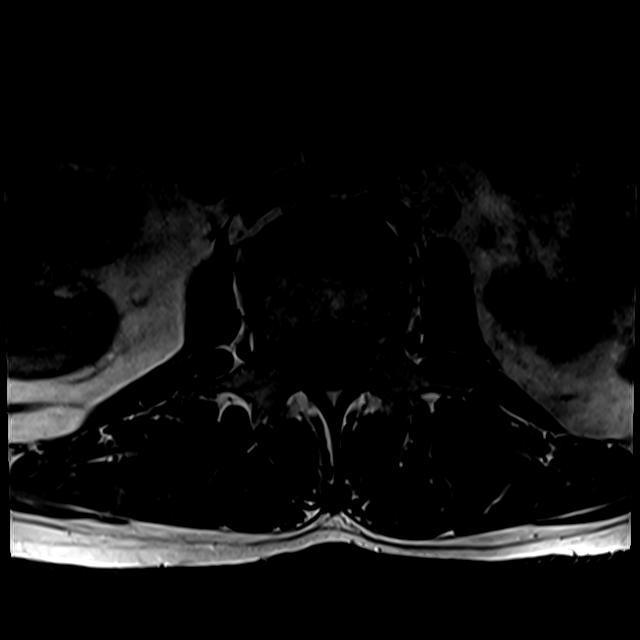

[Series 18: T2 · axial · 4.0mm · 0.28mm/px · z∈[-73,+107]mm · 6 of 40 slices shown (2 of 2)]
[im 3/40]
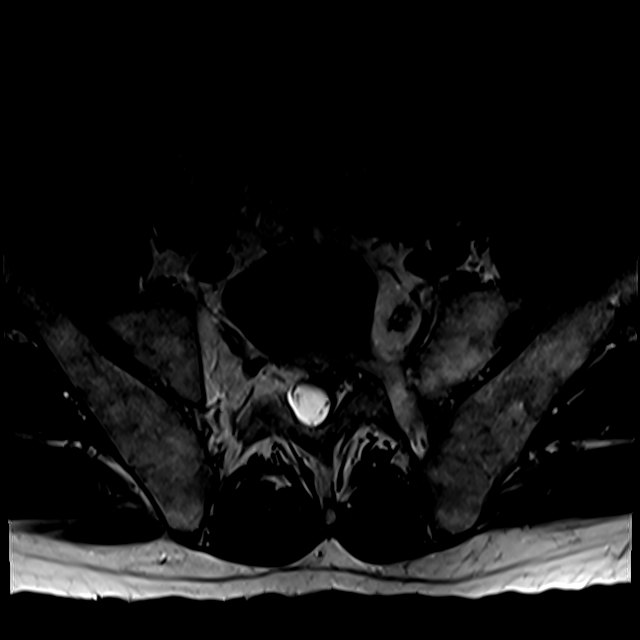
[im 6/40]
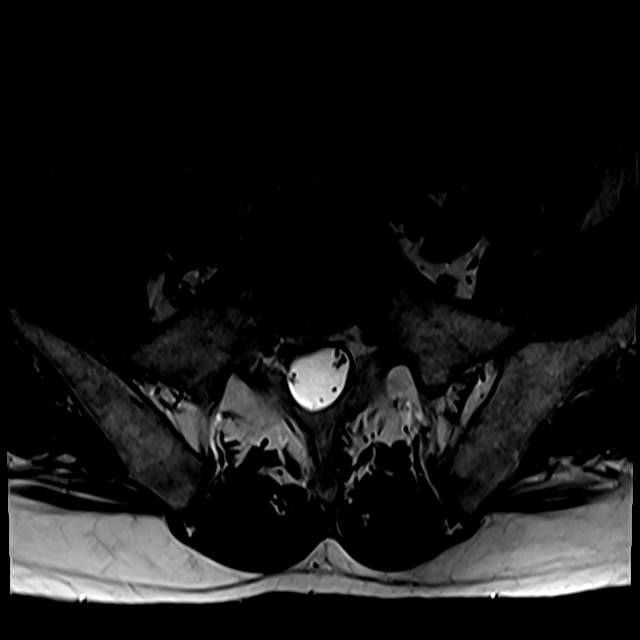
[im 8/40]
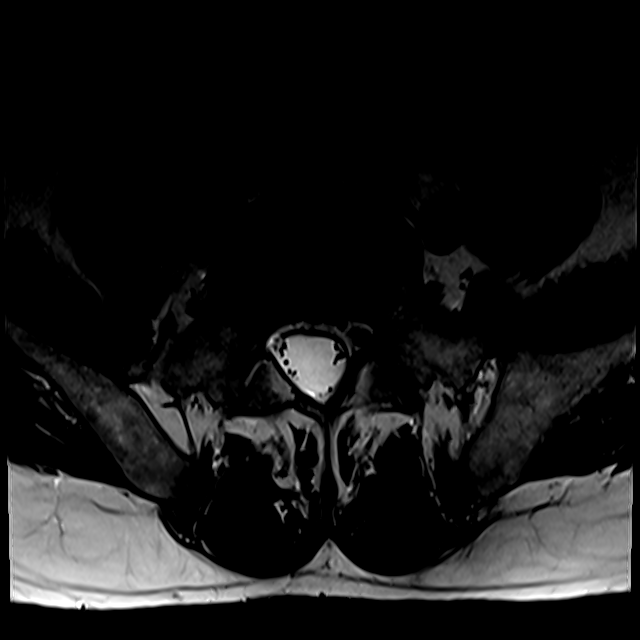
[im 14/40]
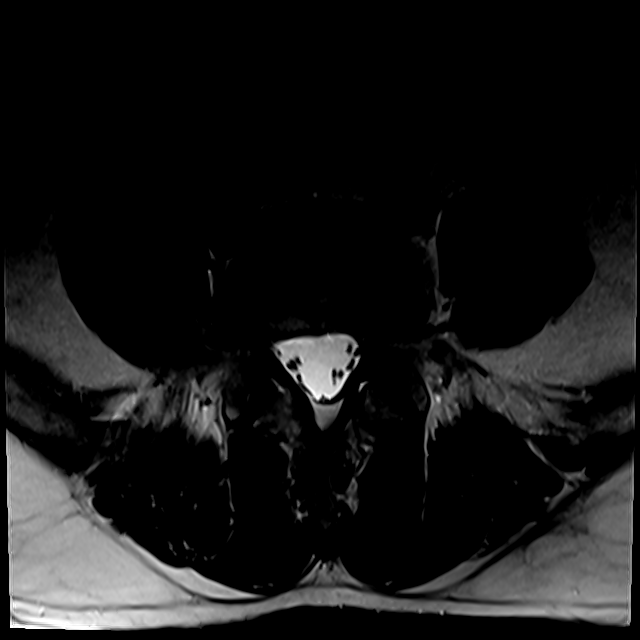
[im 21/40]
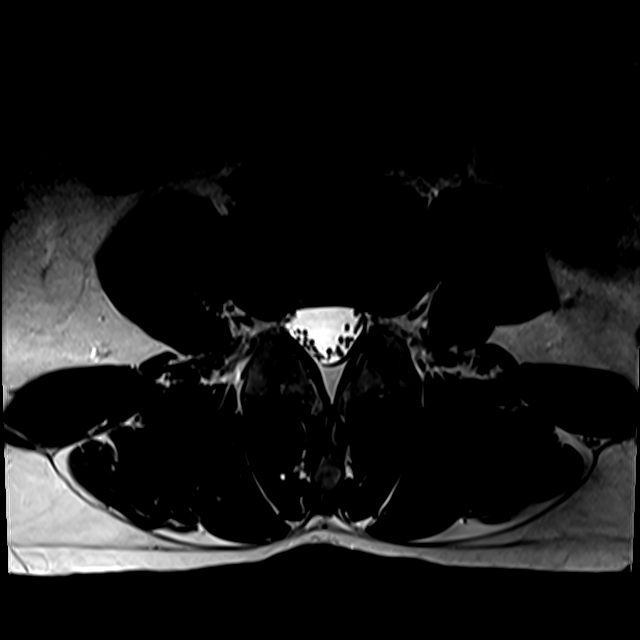
[im 34/40]
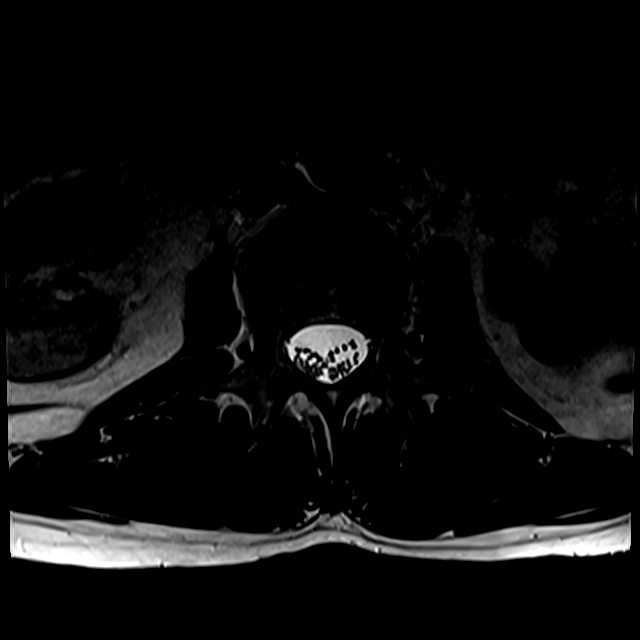

[18 of 48 positions shown; findings below may reference images not displayed]

FINDINGS: Segmentation: Transitional lumbosacral anatomy with partial
lumbarization of the S1 segment.

Alignment: Straightening of the normal lumbar lordosis. Trace
retrolisthesis of L5 on S1.

Vertebrae: Vertebral body heights maintained. No evidence for acute
or chronic fracture. Bone marrow signal intensity within normal
limits. No discrete or worrisome osseous lesions. No abnormal marrow
edema.

Conus medullaris: Extends to the L1 level and appears normal.

Paraspinal and other soft tissues: Paraspinous soft tissues within
normal limits. Left kidney not visualized, and may be absent.
Partially visualized right kidney may be hypertrophied. Crossed
fused ectopia could also be considered.

Disc levels:

L1-2:  Unremarkable.

L2-3:  Unremarkable.

L3-4:  Unremarkable.

L4-5: Diffuse disc bulge with disc desiccation. Superimposed small
right subarticular disc protrusion minimally indents the right
ventral thecal sac. Mild facet and ligament flavum hypertrophy. No
stenosis or impingement. Protrusion minimally

L5-S1: Diffuse disc bulge with disc desiccation. Superimposed left
subarticular disc extrusion contacts the descending left S1 nerve
root in the left lateral recess (series 18, image 32). Extrusion is
fairly small measuring approximately 4 x 4 mm. Mild facet
hypertrophy. No stenosis.
IMPRESSION: 1. Small left subarticular disc extrusion at L5-S1, contacting the
descending S1 nerve root in the left lateral recess.
2. Shallow right subarticular disc protrusion at L4-5 without
stenosis or impingement.
3. Transitional lumbosacral anatomy.

## 2019-04-01 ENCOUNTER — Encounter: Payer: Self-pay | Admitting: Family Medicine

## 2019-04-02 NOTE — Telephone Encounter (Signed)
Yes he should be taking 2 tablets a day. Call in #[phone] with 3 rf

## 2019-04-02 NOTE — Telephone Encounter (Signed)
Okay for requested dosage?

## 2019-04-04 ENCOUNTER — Telehealth: Payer: Self-pay | Admitting: Family Medicine

## 2019-04-04 NOTE — Telephone Encounter (Signed)
Medication: Tamsulosin needing 2 tablets per day in the directions. 90 day supply 180 tablets  Has the patient contacted their pharmacy? Yes  (Agent: If no, request that the patient contact the pharmacy for the refill.) (Agent: If yes, when and what did the pharmacy advise?)  Preferred Pharmacy (with phone number or street name): [facility], [place] - [address] [phone] (Phone) [phone] (Fax)    Agent: Please be advised that RX refills may take up to 3 business days. We ask that you follow-up with your pharmacy.

## 2019-04-04 NOTE — Telephone Encounter (Signed)
This has been taking care of.

## 2019-04-05 NOTE — Telephone Encounter (Signed)
Patient is returning Kendra's calling regarding medication refill.  Can leave a message, Patient may be out of range. And can communicate via mychart.   Please advise [phone]

## 2019-04-05 NOTE — Telephone Encounter (Signed)
Left message to return phone call.

## 2019-04-05 NOTE — Telephone Encounter (Signed)
Spoke to pt and advised that Rx has been sent as requested already. Pt stated that the pharmacy gave him 30 days supply only. Advised pt that I will call them Monday to see what's going on.

## 2019-04-08 ENCOUNTER — Other Ambulatory Visit: Payer: Self-pay

## 2019-04-08 MED ORDER — TAMSULOSIN HCL 0.4 MG PO CAPS: ORAL_CAPSULE | ORAL | 1 refills | Status: DC

## 2019-04-08 NOTE — Telephone Encounter (Signed)
Spoke to pt and he was advised that the medication was refilled. Pt then asked if insurance would cover that much at one time. I advised pt that it was the  requested amount he had asked for and he would need to contact the pharmacy and the insurance company for advise. Pt verbalized understanding.

## 2019-04-18 ENCOUNTER — Other Ambulatory Visit: Payer: Self-pay | Admitting: Family Medicine

## 2019-05-13 ENCOUNTER — Ambulatory Visit: Payer: BC Managed Care – PPO | Attending: Internal Medicine

## 2019-05-13 DIAGNOSIS — R238 Other skin changes: Secondary | ICD-10-CM

## 2019-05-13 DIAGNOSIS — U071 COVID-19: Secondary | ICD-10-CM

## 2019-05-14 LAB — NOVEL CORONAVIRUS, NAA: SARS-CoV-2, NAA: NOT DETECTED

## 2019-06-17 ENCOUNTER — Other Ambulatory Visit: Payer: Self-pay | Admitting: Family Medicine

## 2019-06-17 NOTE — Telephone Encounter (Signed)
Last filled 03/21/2018 Last OV 11/27/2018  Ok to fill?

## 2019-07-19 DIAGNOSIS — M25511 Pain in right shoulder: Secondary | ICD-10-CM | POA: Insufficient documentation

## 2019-08-22 DIAGNOSIS — M19011 Primary osteoarthritis, right shoulder: Secondary | ICD-10-CM | POA: Insufficient documentation

## 2019-09-24 ENCOUNTER — Other Ambulatory Visit: Payer: Self-pay | Admitting: Family Medicine

## 2019-11-05 DIAGNOSIS — M47816 Spondylosis without myelopathy or radiculopathy, lumbar region: Secondary | ICD-10-CM | POA: Insufficient documentation

## 2019-11-05 DIAGNOSIS — M545 Low back pain, unspecified: Secondary | ICD-10-CM | POA: Insufficient documentation

## 2020-02-06 ENCOUNTER — Other Ambulatory Visit: Payer: Self-pay | Admitting: Family Medicine

## 2020-02-20 ENCOUNTER — Other Ambulatory Visit: Payer: Self-pay

## 2020-02-21 ENCOUNTER — Ambulatory Visit (INDEPENDENT_AMBULATORY_CARE_PROVIDER_SITE_OTHER): Payer: BC Managed Care – PPO | Admitting: Family Medicine

## 2020-02-21 ENCOUNTER — Encounter: Payer: Self-pay | Admitting: Family Medicine

## 2020-02-21 VITALS — BP 122/79 | HR 73 | Temp 97.9°F | Resp 16 | Ht 69.0 in | Wt 191.6 lb

## 2020-02-21 DIAGNOSIS — E785 Hyperlipidemia, unspecified: Secondary | ICD-10-CM | POA: Diagnosis not present

## 2020-02-21 DIAGNOSIS — N138 Other obstructive and reflux uropathy: Secondary | ICD-10-CM

## 2020-02-21 DIAGNOSIS — G43909 Migraine, unspecified, not intractable, without status migrainosus: Secondary | ICD-10-CM | POA: Diagnosis not present

## 2020-02-21 DIAGNOSIS — N401 Enlarged prostate with lower urinary tract symptoms: Secondary | ICD-10-CM

## 2020-02-21 DIAGNOSIS — R200 Anesthesia of skin: Secondary | ICD-10-CM | POA: Diagnosis not present

## 2020-02-21 MED ORDER — RIZATRIPTAN BENZOATE 10 MG PO TABS: 10.0000 mg | ORAL_TABLET | ORAL | 5 refills | Status: DC | PRN

## 2020-02-21 MED ORDER — TAMSULOSIN HCL 0.4 MG PO CAPS: ORAL_CAPSULE | ORAL | 3 refills | Status: DC

## 2020-02-21 MED ORDER — FINASTERIDE 5 MG PO TABS: 5.0000 mg | ORAL_TABLET | Freq: Every day | ORAL | 3 refills | Status: DC

## 2020-02-21 NOTE — Progress Notes (Signed)
   Subjective:    Patient ID: Willie Carpenter, male    DOB: [birth year], 64 y.o.   MRN: [id number]  HPI Here for several issues. First he asks to try something other than Imitrex for his migraines. These are infrequent fortunately, and he averages only 1 or 2 a year. When they come however he feels bad for several days. He knows to take the Imitrex as early in the process as possible, but it does not work as well as it used to. Also he asks about nocturia. He takes a total of 0.8 mg of tamsulosin at bedtime, but he still averages getting to urinate 3-6 times a night. This is not a problem during the day. Third he has had numbness in the left great toe for about 6 months. There is no pain. No hx of trauma.    Review of Systems  Constitutional: Negative.   Respiratory: Negative.   Cardiovascular: Negative.   Gastrointestinal: Negative.   Genitourinary: Positive for frequency. Negative for dysuria and flank pain.  Neurological: Positive for numbness and headaches.       Objective:   Physical Exam Constitutional:      Appearance: Normal appearance.  Cardiovascular:     Rate and Rhythm: Normal rate and regular rhythm.     Pulses: Normal pulses.     Heart sounds: Normal heart sounds.  Pulmonary:     Effort: Pulmonary effort is normal.     Breath sounds: Normal breath sounds.  Musculoskeletal:     Right lower leg: No edema.     Left lower leg: No edema.     Comments: The left foot and toes are all normal on exam   Neurological:     General: No focal deficit present.     Mental Status: He is alert and oriented to person, place, and time.           Assessment & Plan:  For his migraines, he will stop Imitrex and try Maxalt as needed. For the nocturia we will add Finasteride 5 mg daily. The toe numbness is likely due to a Morton's neuroma. We agreed to simply monitor this for now.  Gershon Crane, MD

## 2020-03-21 ENCOUNTER — Ambulatory Visit: Payer: Self-pay | Attending: Internal Medicine

## 2020-03-21 DIAGNOSIS — Z23 Encounter for immunization: Secondary | ICD-10-CM

## 2020-03-21 NOTE — Progress Notes (Signed)
   Covid-19 Vaccination Clinic  Name:  Willie Carpenter    MRN: [id number] DOB: 1956/03/29  03/21/2020  [name] was observed post Covid-19 immunization for 15 minutes without incident. She was provided with Vaccine Information Sheet and instruction to access the V-Safe system.   [name] was instructed to call [phone] with any severe reactions post vaccine: Marland Kitchen Difficulty breathing  . Swelling of face and throat  . A fast heartbeat  . A bad rash all over body  . Dizziness and weakness

## 2020-06-15 ENCOUNTER — Telehealth: Payer: Self-pay | Admitting: Physical Medicine and Rehabilitation

## 2020-06-15 NOTE — Telephone Encounter (Signed)
Patient called. He would like an appointment with Dr. Alvester Morin. He has a RX from his doctor. His call back number is [phone]

## 2020-06-16 NOTE — Telephone Encounter (Signed)
Called patient to schedule OV. He has scheduled an appointment somewhere else.

## 2020-06-21 ENCOUNTER — Other Ambulatory Visit: Payer: Self-pay | Admitting: Family Medicine

## 2020-08-06 ENCOUNTER — Other Ambulatory Visit: Payer: Self-pay | Admitting: Family Medicine

## 2020-08-25 ENCOUNTER — Encounter: Payer: Self-pay | Admitting: Family Medicine

## 2020-08-26 ENCOUNTER — Ambulatory Visit: Payer: Self-pay | Admitting: Orthopedic Surgery

## 2020-08-26 NOTE — H&P (Signed)
Willie Carpenter is an 65 y.o. male.   Chief Complaint: back and L leg pain HPI: Reason for Visit: (normal) visit for: (back); The patient is 3 1/2 months out from onset Location (Lower Extremity): lower back pain on the left; thigh pain on the left, , posterior; left buttock Severity: pain level 6/10 Quality: sharp; aching; dull Aggravating Factors: standing for long periods of time; sitting for long periods of time Associated Symptoms: weakness (BLE); no numbness/tingling Medications: The patient is taking Celebrex, Hydrocodone, and Gabapentin 300mg  2 tabs tid Notes: The patient is 6 days out from T-F ESI L5-S1. He has spoken with a colleague who used a higher dose of gabapentin and he has increased his gabapentin to 2 tablets 3 times a day  Past Medical History:  Diagnosis Date  . Allergy   . GERD (gastroesophageal reflux disease)    diet related- PRN zantac OTC  . Hyperlipidemia   . Postherpetic neuralgia   . Sciatica, left side     Past Surgical History:  Procedure Laterality Date  . COLONOSCOPY  08/03/2017   per Dr. 08/05/2017, sigmoid diverticulosis, no polyps, repeat in 10 yrs   . EPIDURAL BLOCK INJECTION  Feb 2008 and May 2008   lumbar, in June 2008   . KNEE SURGERY     key hole surgery 2016  . LUMBAR DISC SURGERY  2007   in 2008, at L5-S1   . RHINOPLASTY    . SPINE SURGERY    . TONSILLECTOMY AND ADENOIDECTOMY  1972    Family History  Problem Relation Age of Onset  . Arthritis Mother   . Arthritis Father   . Heart disease Father   . Hypertension Father   . Hyperlipidemia Father   . Hyperlipidemia Brother   . Heart disease Brother   . Colon cancer Neg Hx   . Colon polyps Neg Hx   . Rectal cancer Neg Hx   . Stomach cancer Neg Hx    Social History:  reports that he has never smoked. He has never used smokeless tobacco. He reports current alcohol use. He reports that he does not use drugs.  Allergies:  Allergies  Allergen Reactions  . Azithromycin      Stomach pain    Medications atorvastatin 20 mg tablet CeleBREX 200 mg capsule famotidine 40 mg tablet finasteride 5 mg tablet gabapentin 300 mg capsule HYDROcodone 5 mg-acetaminophen 325 mg tablet rizatriptan 10 mg tablet tamsulosin 0.4 mg capsule  Review of Systems  Constitutional: Negative.   HENT: Negative.   Eyes: Negative.   Respiratory: Negative.   Cardiovascular: Negative.   Gastrointestinal: Negative.   Endocrine: Negative.   Genitourinary: Negative.   Musculoskeletal: Positive for back pain.  Allergic/Immunologic: Negative.   Neurological: Positive for weakness and numbness.  Psychiatric/Behavioral: Negative.     There were no vitals taken for this visit. Physical Exam Constitutional:      Appearance: Normal appearance.  HENT:     Head: Normocephalic.     Right Ear: External ear normal.     Left Ear: External ear normal.     Nose: Nose normal.     Mouth/Throat:     Pharynx: Oropharynx is clear.  Eyes:     Conjunctiva/sclera: Conjunctivae normal.  Cardiovascular:     Rate and Rhythm: Normal rate and regular rhythm.     Pulses: Normal pulses.     Heart sounds: Normal heart sounds.  Pulmonary:     Effort: Pulmonary effort is normal.  Breath sounds: Normal breath sounds.  Abdominal:     General: Bowel sounds are normal.  Musculoskeletal:     Cervical back: Normal range of motion.     Comments: Gait and Station: Appearance: ambulating with no assistive devices and antalgic gait.  Constitutional: General Appearance: healthy-appearing and distress (mild).  Psychiatric: Mood and Affect: active and alert.  Cardiovascular System: Edema Right: none; Dorsalis and posterior tibial pulses 2+. Edema Left: none.  Abdomen: Inspection and Palpation: non-distended and no tenderness.  Skin: Inspection and palpation: no rash.  Lumbar Spine: Inspection: normal alignment. Bony Palpation of the Lumbar Spine: tender at lumbosacral junction.. Bony Palpation of the  Right Hip: no tenderness of the greater trochanter and tenderness of the SI joint; Pelvis stable. Bony Palpation of the Left Hip: no tenderness of the greater trochanter and tenderness of the SI joint. Soft Tissue Palpation on the Right: No flank pain with percussion. Active Range of Motion: limited flexion and extention.  Motor Strength: L1 Motor Strength on the Right: hip flexion iliopsoas 5/5. L1 Motor Strength on the Left: hip flexion iliopsoas 5/5. L2-L4 Motor Strength on the Right: knee extension quadriceps 5/5. L2-L4 Motor Strength on the Left: knee extension quadriceps 5/5. L5 Motor Strength on the Right: ankle dorsiflexion tibialis anterior 5/5 and great toe extension extensor hallucis longus 5/5. L5 Motor Strength on the Left: ankle dorsiflexion tibialis anterior 5/5 and great toe extension extensor hallucis longus 5/5. S1 Motor Strength on the Right: plantar flexion gastrocnemius 5/5. S1 Motor Strength on the Left: plantar flexion gastrocnemius 5/5.  Neurological System: Knee Reflex Right: normal (2). Knee Reflex Left: normal (2). Ankle Reflex Right: normal (2). Ankle Reflex Left: normal (2). Babinski Reflex Right: plantar reflex absent. Babinski Reflex Left: plantar reflex absent. Sensation on the Right: normal distal extremities. Sensation on the Left: normal distal extremities. Special Tests on the Right: no clonus of the ankle/knee. Special Tests on the Left: no clonus of the ankle/knee and seated straight leg raising test positive.  Neurological:     Mental Status: He is alert.    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 There is a disc herniation L4-5 paracentral to the right effacing the right L5 nerve root.  Previous x-rays of the lumbar spine demonstrates disc degeneration L5-S1. A transitional segment. Minimal osteoarthritis of the hips.   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 Neural tension signs. Slightly diminished plantar flexion. Less mechanical back pain. This has been refractory to extensive physical therapy activity modification analgesics and injection therapy. This extended conservative treatment was indicated as he has a fairly small disc herniation. He however has persistent neural tension signs.  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    Plan:  I discussion with the patient and his wife concerning his current situation. He has had refractory radiculopathy. We discussed options that include living with his symptoms with pain management versus consideration of a microlumbar decompression. Given the persistent neurotension signs failing conservative treatment and requiring increased analgesics I would recommend consideration of a micro lumbar decompression.  Patient chooses to proceed with that.  I had an extensive discussion with the patient concerning the pathology relevant anatomy and treatment options. At this point exhausting conservative treatment and in the presence of a neurologic deficit we discussed microlumbar decompression. I discussed  the risks and benefits including bleeding, infection, DVT, PE, anesthetic complications, worsening in their symptoms, improvement in their symptoms, C SF leakage, epidural fibrosis, need for future surgeries such as revision discectomy and lumbar fusion. I also indicated that this is an operation to basically decompress the nerve root to allow recovery as opposed to fixing a herniated disc and that the incidence of recurrent chest disc herniation can approach 15%. Also that nerve root recovery is variable and may not recover completely.  I discussed the operative course including overnight in the hospital. Immediate ambulation. Follow-up in 2 weeks for suture removal. 6 weeks until  healing of the herniation followed by 6 weeks of reconditioning and strengthening of the core musculature. Also discussed the need to employ the concepts of disc pressure management and core motion following the surgery to minimize the risk of recurrent disc herniation. We will obtain preoperative clearance i if necessary and proceed accordingly.  Continue with Chad Parker  Continue as needed hydrocodone and gabapentin. As well as Celebrex.  Refill on his analgesics.  Plan microlumbar decompression L5-S1 left  Christianna Belmonte M Jawanda Passey, PA-C for Dr. Beane 08/26/2020, 4:41 PM   

## 2020-08-26 NOTE — Telephone Encounter (Signed)
The form is ready  

## 2020-08-26 NOTE — H&P (View-Only) (Signed)
Willie Carpenter is an 65 y.o. male.   Chief Complaint: back and L leg pain HPI: Reason for Visit: (normal) visit for: (back); The patient is 3 1/2 months out from onset Location (Lower Extremity): lower back pain on the left; thigh pain on the left, , posterior; left buttock Severity: pain level 6/10 Quality: sharp; aching; dull Aggravating Factors: standing for long periods of time; sitting for long periods of time Associated Symptoms: weakness (BLE); no numbness/tingling Medications: The patient is taking Celebrex, Hydrocodone, and Gabapentin 300mg  2 tabs tid Notes: The patient is 6 days out from T-F ESI L5-S1. He has spoken with a colleague who used a higher dose of gabapentin and he has increased his gabapentin to 2 tablets 3 times a day  Past Medical History:  Diagnosis Date  . Allergy   . GERD (gastroesophageal reflux disease)    diet related- PRN zantac OTC  . Hyperlipidemia   . Postherpetic neuralgia   . Sciatica, left side     Past Surgical History:  Procedure Laterality Date  . COLONOSCOPY  08/03/2017   per Dr. 08/05/2017, sigmoid diverticulosis, no polyps, repeat in 10 yrs   . EPIDURAL BLOCK INJECTION  Feb 2008 and May 2008   lumbar, in June 2008   . KNEE SURGERY     key hole surgery 2016  . LUMBAR DISC SURGERY  2007   in 2008, at L5-S1   . RHINOPLASTY    . SPINE SURGERY    . TONSILLECTOMY AND ADENOIDECTOMY  1972    Family History  Problem Relation Age of Onset  . Arthritis Mother   . Arthritis Father   . Heart disease Father   . Hypertension Father   . Hyperlipidemia Father   . Hyperlipidemia Brother   . Heart disease Brother   . Colon cancer Neg Hx   . Colon polyps Neg Hx   . Rectal cancer Neg Hx   . Stomach cancer Neg Hx    Social History:  reports that he has never smoked. He has never used smokeless tobacco. He reports current alcohol use. He reports that he does not use drugs.  Allergies:  Allergies  Allergen Reactions  . Azithromycin      Stomach pain    Medications atorvastatin 20 mg tablet CeleBREX 200 mg capsule famotidine 40 mg tablet finasteride 5 mg tablet gabapentin 300 mg capsule HYDROcodone 5 mg-acetaminophen 325 mg tablet rizatriptan 10 mg tablet tamsulosin 0.4 mg capsule  Review of Systems  Constitutional: Negative.   HENT: Negative.   Eyes: Negative.   Respiratory: Negative.   Cardiovascular: Negative.   Gastrointestinal: Negative.   Endocrine: Negative.   Genitourinary: Negative.   Musculoskeletal: Positive for back pain.  Allergic/Immunologic: Negative.   Neurological: Positive for weakness and numbness.  Psychiatric/Behavioral: Negative.     There were no vitals taken for this visit. Physical Exam Constitutional:      Appearance: Normal appearance.  HENT:     Head: Normocephalic.     Right Ear: External ear normal.     Left Ear: External ear normal.     Nose: Nose normal.     Mouth/Throat:     Pharynx: Oropharynx is clear.  Eyes:     Conjunctiva/sclera: Conjunctivae normal.  Cardiovascular:     Rate and Rhythm: Normal rate and regular rhythm.     Pulses: Normal pulses.     Heart sounds: Normal heart sounds.  Pulmonary:     Effort: Pulmonary effort is normal.  Breath sounds: Normal breath sounds.  Abdominal:     General: Bowel sounds are normal.  Musculoskeletal:     Cervical back: Normal range of motion.     Comments: Gait and Station: Appearance: ambulating with no assistive devices and antalgic gait.  Constitutional: General Appearance: healthy-appearing and distress (mild).  Psychiatric: Mood and Affect: active and alert.  Cardiovascular System: Edema Right: none; Dorsalis and posterior tibial pulses 2+. Edema Left: none.  Abdomen: Inspection and Palpation: non-distended and no tenderness.  Skin: Inspection and palpation: no rash.  Lumbar Spine: Inspection: normal alignment. Bony Palpation of the Lumbar Spine: tender at lumbosacral junction.. Bony Palpation of the  Right Hip: no tenderness of the greater trochanter and tenderness of the SI joint; Pelvis stable. Bony Palpation of the Left Hip: no tenderness of the greater trochanter and tenderness of the SI joint. Soft Tissue Palpation on the Right: No flank pain with percussion. Active Range of Motion: limited flexion and extention.  Motor Strength: L1 Motor Strength on the Right: hip flexion iliopsoas 5/5. L1 Motor Strength on the Left: hip flexion iliopsoas 5/5. L2-L4 Motor Strength on the Right: knee extension quadriceps 5/5. L2-L4 Motor Strength on the Left: knee extension quadriceps 5/5. L5 Motor Strength on the Right: ankle dorsiflexion tibialis anterior 5/5 and great toe extension extensor hallucis longus 5/5. L5 Motor Strength on the Left: ankle dorsiflexion tibialis anterior 5/5 and great toe extension extensor hallucis longus 5/5. S1 Motor Strength on the Right: plantar flexion gastrocnemius 5/5. S1 Motor Strength on the Left: plantar flexion gastrocnemius 5/5.  Neurological System: Knee Reflex Right: normal (2). Knee Reflex Left: normal (2). Ankle Reflex Right: normal (2). Ankle Reflex Left: normal (2). Babinski Reflex Right: plantar reflex absent. Babinski Reflex Left: plantar reflex absent. Sensation on the Right: normal distal extremities. Sensation on the Left: normal distal extremities. Special Tests on the Right: no clonus of the ankle/knee. Special Tests on the Left: no clonus of the ankle/knee and seated straight leg raising test positive.  Neurological:     Mental Status: He is alert.    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 There is a disc herniation L4-5 paracentral to the right effacing the right L5 nerve root.  Previous x-rays of the lumbar spine demonstrates disc degeneration L5-S1. A transitional segment. Minimal osteoarthritis of the hips.   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 Neural tension signs. Slightly diminished plantar flexion. Less mechanical back pain. This has been refractory to extensive physical therapy activity modification analgesics and injection therapy. This extended conservative treatment was indicated as he has a fairly small disc herniation. He however has persistent neural tension signs.  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    Plan:  I discussion with the patient and his wife concerning his current situation. He has had refractory radiculopathy. We discussed options that include living with his symptoms with pain management versus consideration of a microlumbar decompression. Given the persistent neurotension signs failing conservative treatment and requiring increased analgesics I would recommend consideration of a micro lumbar decompression.  Patient chooses to proceed with that.  I had an extensive discussion with the patient concerning the pathology relevant anatomy and treatment options. At this point exhausting conservative treatment and in the presence of a neurologic deficit we discussed microlumbar decompression. I discussed  the risks and benefits including bleeding, infection, DVT, PE, anesthetic complications, worsening in their symptoms, improvement in their symptoms, C SF leakage, epidural fibrosis, need for future surgeries such as revision discectomy and lumbar fusion. I also indicated that this is an operation to basically decompress the nerve root to allow recovery as opposed to fixing a herniated disc and that the incidence of recurrent chest disc herniation can approach 15%. Also that nerve root recovery is variable and may not recover completely.  I discussed the operative course including overnight in the hospital. Immediate ambulation. Follow-up in 2 weeks for suture removal. 6 weeks until  healing of the herniation followed by 6 weeks of reconditioning and strengthening of the core musculature. Also discussed the need to employ the concepts of disc pressure management and core motion following the surgery to minimize the risk of recurrent disc herniation. We will obtain preoperative clearance i if necessary and proceed accordingly.  Continue with [place]  Continue as needed hydrocodone and gabapentin. As well as Celebrex.  Refill on his analgesics.  Plan microlumbar decompression L5-S1 left  Dorothy Spark, PA-C for Dr. Shelle Iron 08/26/2020, 4:41 PM

## 2020-09-01 NOTE — Progress Notes (Signed)
Your procedure is scheduled on Friday September 04, 2020.  Report to [facility] Main Entrance "A" at 10:30 A.M., and check in at the Admitting office.  Call this number if you have problems the morning of surgery: [phone]  Call [phone] if you have any questions prior to your surgery date Monday-Friday 8am-4pm   Remember: Do not eat after midnight the night before your surgery  You may drink clear liquids until 09:30 AM the morning of your surgery.   Clear liquids allowed are: Water, Non-Citrus Juices (without pulp), Carbonated Beverages, Clear Tea, Black Coffee Only, and Gatorade  Please complete your PRE-SURGERY ENSURE that was provided to you by 09:30 AM the morning of your surgery.   Please, if able, drink it in one setting. DO NOT SIP.    Take these medicines the morning of surgery with A SIP OF WATER: atorvastatin (LIPITOR)  famotidine (PEPCID) finasteride (PROSCAR) gabapentin (NEURONTIN)  If needed: acetaminophen (TYLENOL) cetirizine (ZYRTEC) HYDROcodone-acetaminophen (NORCO/VICODIN)   As of today, STOP taking any celecoxib (CELEBREX), Aspirin (unless otherwise instructed by your surgeon), Aleve, Naproxen, Ibuprofen, Motrin, Advil, Goody's, BC's, all herbal medications, fish oil, and all vitamins.    The Morning of Surgery  Do not wear jewelry  Do not wear lotions, powders, colognes, or deodorant Men may shave face and neck.  Do not bring valuables to the hospital.  [facility] is not responsible for any belongings or valuables.  If you are a smoker, DO NOT Smoke 24 hours prior to surgery  If you wear a CPAP at night please bring your mask the morning of surgery   Remember that you must have someone to transport you home after your surgery, and remain with you for 24 hours if you are discharged the same day.   Please bring cases for contacts, glasses, hearing aids, dentures or bridgework because it cannot be worn into surgery.    Leave your suitcase in  the car.  After surgery it may be brought to your room.  For patients admitted to the hospital, discharge time will be determined by your treatment team.  Patients discharged the day of surgery will not be allowed to drive home.    Special instructions:   [facility]- Preparing For Surgery  Before surgery, you can play an important role. Because skin is not sterile, your skin needs to be as free of germs as possible. You can reduce the number of germs on your skin by washing with CHG (chlorahexidine gluconate) Soap before surgery.  CHG is an antiseptic cleaner which kills germs and bonds with the skin to continue killing germs even after washing.    Oral Hygiene is also important to reduce your risk of infection.  Remember - BRUSH YOUR TEETH THE MORNING OF SURGERY WITH YOUR REGULAR TOOTHPASTE  Please do not use if you have an allergy to CHG or antibacterial soaps. If your skin becomes reddened/irritated stop using the CHG.  Do not shave (including legs and underarms) for at least 48 hours prior to first CHG shower. It is OK to shave your face.  Please follow these instructions carefully.   1. Shower the NIGHT BEFORE SURGERY and the MORNING OF SURGERY with CHG Soap.   2. If you chose to wash your hair and body, wash as usual with your normal shampoo and body-wash/soap.  3. Rinse your hair and body thoroughly to remove the shampoo and soap.  4. Apply CHG directly to the skin (ONLY FROM THE NECK DOWN) and  wash gently with a scrungie or a clean washcloth.   5. Do not use on open wounds or open sores. Avoid contact with your eyes, ears, mouth and genitals (private parts). Wash Face and genitals (private parts)  with your normal soap.   6. Wash thoroughly, paying special attention to the area where your surgery will be performed.  7. Thoroughly rinse your body with warm water from the neck down.  8. DO NOT shower/wash with your normal soap after using and rinsing off the CHG  Soap.  9. Pat yourself dry with a CLEAN TOWEL.  10. Wear CLEAN PAJAMAS to bed the night before surgery  11. Place CLEAN SHEETS on your bed the night of your first shower and DO NOT SLEEP WITH PETS.  12. Wear comfortable clothes the morning of surgery.     Day of Surgery:  Please shower the morning of surgery with the CHG soap Do not apply any deodorants/lotions. Please wear clean clothes to the hospital/surgery center.   Remember to brush your teeth WITH YOUR REGULAR TOOTHPASTE.   Please read over the following fact sheets that you were given.

## 2020-09-01 NOTE — Telephone Encounter (Signed)
Pt surgical clearance was faxed to [facility] and a copy sent to scanning

## 2020-09-02 ENCOUNTER — Encounter (HOSPITAL_COMMUNITY): Payer: Self-pay

## 2020-09-02 ENCOUNTER — Ambulatory Visit (HOSPITAL_COMMUNITY)
Admission: RE | Admit: 2020-09-02 | Discharge: 2020-09-02 | Disposition: A | Discharge status: Home / Self Care | Payer: BC Managed Care – PPO | Source: Ambulatory Visit | Attending: Orthopedic Surgery | Admitting: Orthopedic Surgery

## 2020-09-02 ENCOUNTER — Other Ambulatory Visit: Payer: Self-pay

## 2020-09-02 ENCOUNTER — Encounter (HOSPITAL_COMMUNITY)
Admission: RE | Admit: 2020-09-02 | Discharge: 2020-09-02 | Disposition: A | Discharge status: Home / Self Care | Payer: BC Managed Care – PPO | Source: Ambulatory Visit | Attending: Specialist | Admitting: Specialist

## 2020-09-02 DIAGNOSIS — Z01818 Encounter for other preprocedural examination: Secondary | ICD-10-CM | POA: Diagnosis not present

## 2020-09-02 DIAGNOSIS — M48061 Spinal stenosis, lumbar region without neurogenic claudication: Secondary | ICD-10-CM | POA: Diagnosis not present

## 2020-09-02 DIAGNOSIS — Z20822 Contact with and (suspected) exposure to covid-19: Secondary | ICD-10-CM | POA: Insufficient documentation

## 2020-09-02 DIAGNOSIS — M5126 Other intervertebral disc displacement, lumbar region: Secondary | ICD-10-CM | POA: Insufficient documentation

## 2020-09-02 DIAGNOSIS — E785 Hyperlipidemia, unspecified: Secondary | ICD-10-CM | POA: Diagnosis not present

## 2020-09-02 DIAGNOSIS — M4807 Spinal stenosis, lumbosacral region: Secondary | ICD-10-CM | POA: Diagnosis not present

## 2020-09-02 DIAGNOSIS — M5117 Intervertebral disc disorders with radiculopathy, lumbosacral region: Secondary | ICD-10-CM | POA: Diagnosis not present

## 2020-09-02 DIAGNOSIS — Z881 Allergy status to other antibiotic agents status: Secondary | ICD-10-CM | POA: Diagnosis not present

## 2020-09-02 HISTORY — DX: Ectopic kidney: Q63.2

## 2020-09-02 HISTORY — DX: Headache, unspecified: R51.9

## 2020-09-02 LAB — CBC
HCT: 38.6 % — ABNORMAL LOW (ref 39.0–52.0)
Hemoglobin: 13.4 g/dL (ref 13.0–17.0)
MCH: 31.5 pg (ref 26.0–34.0)
MCHC: 34.7 g/dL (ref 30.0–36.0)
MCV: 90.6 fL (ref 80.0–100.0)
Platelets: 321 10*3/uL (ref 150–400)
RBC: 4.26 MIL/uL (ref 4.22–5.81)
RDW: 12.7 % (ref 11.5–15.5)
WBC: 5.3 10*3/uL (ref 4.0–10.5)
nRBC: 0 % (ref 0.0–0.2)

## 2020-09-02 LAB — BASIC METABOLIC PANEL
Anion gap: 9 (ref 5–15)
BUN: 20 mg/dL (ref 8–23)
CO2: 26 mmol/L (ref 22–32)
Calcium: 9.4 mg/dL (ref 8.9–10.3)
Chloride: 99 mmol/L (ref 98–111)
Creatinine, Ser: 0.92 mg/dL (ref 0.61–1.24)
GFR, Estimated: >60 mL/min (ref >60)
Glucose, Bld: 98 mg/dL (ref 70–99)
Potassium: 4 mmol/L (ref 3.5–5.1)
Sodium: 134 mmol/L — ABNORMAL LOW (ref 135–145)

## 2020-09-02 LAB — SURGICAL PCR SCREEN
MRSA, PCR: NEGATIVE
Staphylococcus aureus: NEGATIVE

## 2020-09-02 LAB — SARS CORONAVIRUS 2 (TAT 6-24 HRS): SARS Coronavirus 2: NEGATIVE

## 2020-09-02 NOTE — Progress Notes (Signed)
PCP - Dr. Gershon Crane Cardiologist - denies   PPM/ICD - denies chest x-ray - 09/02/20 EKG - N/A Stress Test - denies  ECHO - denies Cardiac Cath denies      Follow your surgeon's instructions on when to stop Aspirin.  If no instructions were given by your surgeon then you will need to call the office to get those instructions.    ERAS Protcol -yes. PRE-SURGERY Ensure or G2- Ensure given  COVID TEST- 09/02/20   Anesthesia review: no  Patient denies shortness of breath, fever, cough and chest pain at PAT appointment   All instructions explained to the patient, with a verbal understanding of the material. Patient agrees to go over the instructions while at home for a better understanding. Patient also instructed to self quarantine after being tested for COVID-19. The opportunity to ask questions was provided.

## 2020-09-04 ENCOUNTER — Other Ambulatory Visit: Payer: Self-pay

## 2020-09-04 ENCOUNTER — Encounter (HOSPITAL_COMMUNITY)
Admission: RE | Disposition: A | Discharge status: Home / Self Care | Payer: Self-pay | Source: Home / Self Care | Attending: Specialist

## 2020-09-04 ENCOUNTER — Ambulatory Visit (HOSPITAL_COMMUNITY): Payer: BC Managed Care – PPO

## 2020-09-04 ENCOUNTER — Ambulatory Visit (HOSPITAL_COMMUNITY): Payer: BC Managed Care – PPO | Admitting: Physician Assistant

## 2020-09-04 ENCOUNTER — Encounter (HOSPITAL_COMMUNITY): Payer: Self-pay | Admitting: Specialist

## 2020-09-04 ENCOUNTER — Ambulatory Visit (HOSPITAL_COMMUNITY)
Admission: RE | Admit: 2020-09-04 | Discharge: 2020-09-04 | Disposition: A | Discharge status: Home / Self Care | Payer: BC Managed Care – PPO | Attending: Specialist | Admitting: Specialist

## 2020-09-04 ENCOUNTER — Ambulatory Visit (HOSPITAL_COMMUNITY): Payer: BC Managed Care – PPO | Admitting: Anesthesiology

## 2020-09-04 DIAGNOSIS — M5126 Other intervertebral disc displacement, lumbar region: Secondary | ICD-10-CM | POA: Diagnosis present

## 2020-09-04 DIAGNOSIS — E785 Hyperlipidemia, unspecified: Secondary | ICD-10-CM | POA: Diagnosis not present

## 2020-09-04 DIAGNOSIS — Z881 Allergy status to other antibiotic agents status: Secondary | ICD-10-CM | POA: Diagnosis not present

## 2020-09-04 DIAGNOSIS — M4807 Spinal stenosis, lumbosacral region: Secondary | ICD-10-CM | POA: Diagnosis not present

## 2020-09-04 DIAGNOSIS — M48061 Spinal stenosis, lumbar region without neurogenic claudication: Secondary | ICD-10-CM | POA: Insufficient documentation

## 2020-09-04 DIAGNOSIS — M5117 Intervertebral disc disorders with radiculopathy, lumbosacral region: Secondary | ICD-10-CM | POA: Insufficient documentation

## 2020-09-04 DIAGNOSIS — Z419 Encounter for procedure for purposes other than remedying health state, unspecified: Secondary | ICD-10-CM

## 2020-09-04 DIAGNOSIS — Z20822 Contact with and (suspected) exposure to covid-19: Secondary | ICD-10-CM | POA: Insufficient documentation

## 2020-09-04 HISTORY — PX: LUMBAR LAMINECTOMY/DECOMPRESSION MICRODISCECTOMY: SHX5026

## 2020-09-04 SURGERY — LUMBAR LAMINECTOMY/DECOMPRESSION MICRODISCECTOMY 1 LEVEL
Anesthesia: General | Laterality: Left

## 2020-09-04 MED ORDER — ROCURONIUM BROMIDE 10 MG/ML (PF) SYRINGE
PREFILLED_SYRINGE | INTRAVENOUS | Status: DC | PRN
  Administered 2020-09-04: 60 mg via INTRAVENOUS
  Administered 2020-09-04: 30 mg via INTRAVENOUS
  Administered 2020-09-04: 20 mg via INTRAVENOUS

## 2020-09-04 MED ORDER — ONDANSETRON HCL 4 MG PO TABS
4.0000 mg | ORAL_TABLET | Freq: Four times a day (QID) | ORAL | Status: DC | PRN
Start: 2020-09-04 — End: 2020-09-04

## 2020-09-04 MED ORDER — RISAQUAD PO CAPS: 1.0000 | ORAL_CAPSULE | Freq: Every day | ORAL | Status: DC

## 2020-09-04 MED ORDER — KCL IN DEXTROSE-NACL 20-5-0.45 MEQ/L-%-% IV SOLN: INTRAVENOUS | Status: DC

## 2020-09-04 MED ORDER — MENTHOL 3 MG MT LOZG: 1.0000 | LOZENGE | OROMUCOSAL | Status: DC | PRN

## 2020-09-04 MED ORDER — METHOCARBAMOL 500 MG PO TABS
ORAL_TABLET | ORAL | Status: AC
  Filled 2020-09-04: qty 1

## 2020-09-04 MED ORDER — PROPOFOL 10 MG/ML IV BOLUS
INTRAVENOUS | Status: AC
  Filled 2020-09-04: qty 20

## 2020-09-04 MED ORDER — ROCURONIUM BROMIDE 10 MG/ML (PF) SYRINGE
PREFILLED_SYRINGE | INTRAVENOUS | Status: AC
  Filled 2020-09-04: qty 10

## 2020-09-04 MED ORDER — GLYCOPYRROLATE PF 0.2 MG/ML IJ SOSY
PREFILLED_SYRINGE | INTRAMUSCULAR | Status: DC | PRN
  Administered 2020-09-04 (×2): .1 mg via INTRAVENOUS

## 2020-09-04 MED ORDER — POLYETHYLENE GLYCOL 3350 17 G PO PACK: 17.0000 g | PACK | Freq: Every day | ORAL | Status: DC | PRN

## 2020-09-04 MED ORDER — PHENYLEPHRINE HCL-NACL 10-0.9 MG/250ML-% IV SOLN
INTRAVENOUS | Status: DC | PRN
  Administered 2020-09-04: 45 ug/min via INTRAVENOUS

## 2020-09-04 MED ORDER — ACETAMINOPHEN 325 MG PO TABS: 650.0000 mg | ORAL_TABLET | ORAL | Status: DC | PRN

## 2020-09-04 MED ORDER — ONDANSETRON HCL 4 MG/2ML IJ SOLN
INTRAMUSCULAR | Status: DC | PRN
  Administered 2020-09-04: 4 mg via INTRAVENOUS

## 2020-09-04 MED ORDER — HYDROMORPHONE HCL 1 MG/ML IJ SOLN: 0.2500 mg | INTRAMUSCULAR | Status: DC | PRN

## 2020-09-04 MED ORDER — ORAL CARE MOUTH RINSE: 15.0000 mL | Freq: Once | OROMUCOSAL | Status: AC

## 2020-09-04 MED ORDER — BUPIVACAINE-EPINEPHRINE 0.5% -1:200000 IJ SOLN
INTRAMUSCULAR | Status: AC
  Filled 2020-09-04: qty 1

## 2020-09-04 MED ORDER — OXYCODONE HCL 5 MG PO TABS
ORAL_TABLET | ORAL | Status: AC
  Filled 2020-09-04: qty 1

## 2020-09-04 MED ORDER — DOCUSATE SODIUM 100 MG PO CAPS: 100.0000 mg | ORAL_CAPSULE | Freq: Two times a day (BID) | ORAL | 1 refills | Status: DC | PRN

## 2020-09-04 MED ORDER — METHOCARBAMOL 500 MG PO TABS
500.0000 mg | ORAL_TABLET | Freq: Four times a day (QID) | ORAL | Status: DC | PRN
Start: 2020-09-04 — End: 2020-09-04
  Administered 2020-09-04: 500 mg via ORAL

## 2020-09-04 MED ORDER — ACETAMINOPHEN 650 MG RE SUPP: 650.0000 mg | RECTAL | Status: DC | PRN

## 2020-09-04 MED ORDER — MAGNESIUM CITRATE PO SOLN
1.0000 | Freq: Once | ORAL | Status: DC | PRN
Start: 2020-09-04 — End: 2020-09-04

## 2020-09-04 MED ORDER — FENTANYL CITRATE (PF) 250 MCG/5ML IJ SOLN
INTRAMUSCULAR | Status: DC | PRN
  Administered 2020-09-04 (×2): 50 ug via INTRAVENOUS

## 2020-09-04 MED ORDER — LACTATED RINGERS IV SOLN: INTRAVENOUS | Status: DC

## 2020-09-04 MED ORDER — METHOCARBAMOL 1000 MG/10ML IJ SOLN: 500.0000 mg | Freq: Four times a day (QID) | INTRAVENOUS | Status: DC | PRN

## 2020-09-04 MED ORDER — BISACODYL 5 MG PO TBEC
5.0000 mg | DELAYED_RELEASE_TABLET | Freq: Every day | ORAL | Status: DC | PRN
Start: 2020-09-04 — End: 2020-09-04

## 2020-09-04 MED ORDER — MIDAZOLAM HCL 2 MG/2ML IJ SOLN
INTRAMUSCULAR | Status: DC | PRN
  Administered 2020-09-04: 2 mg via INTRAVENOUS

## 2020-09-04 MED ORDER — METHOCARBAMOL 500 MG PO TABS: 500.0000 mg | ORAL_TABLET | Freq: Three times a day (TID) | ORAL | 1 refills | Status: DC | PRN

## 2020-09-04 MED ORDER — TAMSULOSIN HCL 0.4 MG PO CAPS: 0.8000 mg | ORAL_CAPSULE | Freq: Every day | ORAL | Status: DC

## 2020-09-04 MED ORDER — MIDAZOLAM HCL 2 MG/2ML IJ SOLN
INTRAMUSCULAR | Status: AC
  Filled 2020-09-04: qty 2

## 2020-09-04 MED ORDER — CEFAZOLIN SODIUM-DEXTROSE 2-4 GM/100ML-% IV SOLN
2.0000 g | INTRAVENOUS | Status: AC
  Administered 2020-09-04: 2 g via INTRAVENOUS
  Filled 2020-09-04: qty 100

## 2020-09-04 MED ORDER — ONDANSETRON HCL 4 MG/2ML IJ SOLN
INTRAMUSCULAR | Status: AC
  Filled 2020-09-04: qty 2

## 2020-09-04 MED ORDER — THROMBIN 20000 UNITS EX SOLR
CUTANEOUS | Status: DC | PRN
  Administered 2020-09-04: 20 mL via TOPICAL

## 2020-09-04 MED ORDER — PHENYLEPHRINE 40 MCG/ML (10ML) SYRINGE FOR IV PUSH (FOR BLOOD PRESSURE SUPPORT)
PREFILLED_SYRINGE | INTRAVENOUS | Status: DC | PRN
  Administered 2020-09-04: 40 ug via INTRAVENOUS
  Administered 2020-09-04: 200 ug via INTRAVENOUS
  Administered 2020-09-04 (×2): 80 ug via INTRAVENOUS

## 2020-09-04 MED ORDER — FAMOTIDINE 20 MG PO TABS: 40.0000 mg | ORAL_TABLET | Freq: Every day | ORAL | Status: DC

## 2020-09-04 MED ORDER — 0.9 % SODIUM CHLORIDE (POUR BTL) OPTIME
TOPICAL | Status: DC | PRN
  Administered 2020-09-04: 1000 mL

## 2020-09-04 MED ORDER — ALUM & MAG HYDROXIDE-SIMETH 200-200-20 MG/5ML PO SUSP: 30.0000 mL | Freq: Four times a day (QID) | ORAL | Status: DC | PRN

## 2020-09-04 MED ORDER — THROMBIN 20000 UNITS EX SOLR
CUTANEOUS | Status: AC
  Filled 2020-09-04: qty 20000

## 2020-09-04 MED ORDER — PROPOFOL 10 MG/ML IV BOLUS
INTRAVENOUS | Status: DC | PRN
  Administered 2020-09-04: 120 mg via INTRAVENOUS

## 2020-09-04 MED ORDER — FINASTERIDE 5 MG PO TABS: 5.0000 mg | ORAL_TABLET | Freq: Every day | ORAL | Status: DC

## 2020-09-04 MED ORDER — LIDOCAINE 2% (20 MG/ML) 5 ML SYRINGE
INTRAMUSCULAR | Status: AC
  Filled 2020-09-04: qty 5

## 2020-09-04 MED ORDER — LIDOCAINE 2% (20 MG/ML) 5 ML SYRINGE
INTRAMUSCULAR | Status: DC | PRN
  Administered 2020-09-04: 60 mg via INTRAVENOUS

## 2020-09-04 MED ORDER — DEXAMETHASONE SODIUM PHOSPHATE 10 MG/ML IJ SOLN
INTRAMUSCULAR | Status: AC
  Filled 2020-09-04: qty 1

## 2020-09-04 MED ORDER — SUGAMMADEX SODIUM 200 MG/2ML IV SOLN
INTRAVENOUS | Status: DC | PRN
  Administered 2020-09-04: 200 mg via INTRAVENOUS

## 2020-09-04 MED ORDER — OXYCODONE HCL 5 MG PO TABS: 5.0000 mg | ORAL_TABLET | ORAL | 0 refills | Status: DC | PRN

## 2020-09-04 MED ORDER — GABAPENTIN 300 MG PO CAPS: 300.0000 mg | ORAL_CAPSULE | Freq: Four times a day (QID) | ORAL | Status: DC

## 2020-09-04 MED ORDER — BUPIVACAINE-EPINEPHRINE 0.5% -1:200000 IJ SOLN
INTRAMUSCULAR | Status: DC | PRN
  Administered 2020-09-04: 4 mL

## 2020-09-04 MED ORDER — POLYETHYLENE GLYCOL 3350 17 G PO PACK: 17.0000 g | PACK | Freq: Every day | ORAL | 0 refills | Status: DC

## 2020-09-04 MED ORDER — EPHEDRINE SULFATE-NACL 50-0.9 MG/10ML-% IV SOSY
PREFILLED_SYRINGE | INTRAVENOUS | Status: DC | PRN
  Administered 2020-09-04: 10 mg via INTRAVENOUS
  Administered 2020-09-04: 15 mg via INTRAVENOUS

## 2020-09-04 MED ORDER — OXYCODONE HCL 5 MG PO TABS: 10.0000 mg | ORAL_TABLET | ORAL | Status: DC | PRN

## 2020-09-04 MED ORDER — FENTANYL CITRATE (PF) 250 MCG/5ML IJ SOLN
INTRAMUSCULAR | Status: AC
  Filled 2020-09-04: qty 5

## 2020-09-04 MED ORDER — DEXAMETHASONE SODIUM PHOSPHATE 10 MG/ML IJ SOLN
INTRAMUSCULAR | Status: DC | PRN
  Administered 2020-09-04: 10 mg via INTRAVENOUS

## 2020-09-04 MED ORDER — CEFAZOLIN SODIUM-DEXTROSE 2-4 GM/100ML-% IV SOLN
2.0000 g | Freq: Three times a day (TID) | INTRAVENOUS | Status: DC
Start: 2020-09-04 — End: 2020-09-04

## 2020-09-04 MED ORDER — CHLORHEXIDINE GLUCONATE 0.12 % MT SOLN
15.0000 mL | Freq: Once | OROMUCOSAL | Status: AC
  Administered 2020-09-04: 15 mL via OROMUCOSAL
  Filled 2020-09-04: qty 15

## 2020-09-04 MED ORDER — GLYCOPYRROLATE PF 0.2 MG/ML IJ SOSY
PREFILLED_SYRINGE | INTRAMUSCULAR | Status: AC
  Filled 2020-09-04: qty 1

## 2020-09-04 MED ORDER — ONDANSETRON HCL 4 MG/2ML IJ SOLN: 4.0000 mg | Freq: Four times a day (QID) | INTRAMUSCULAR | Status: DC | PRN

## 2020-09-04 MED ORDER — DOCUSATE SODIUM 100 MG PO CAPS: 100.0000 mg | ORAL_CAPSULE | Freq: Two times a day (BID) | ORAL | Status: DC

## 2020-09-04 MED ORDER — OXYCODONE HCL 5 MG PO TABS
5.0000 mg | ORAL_TABLET | ORAL | Status: DC | PRN
  Administered 2020-09-04: 5 mg via ORAL

## 2020-09-04 MED ORDER — PHENOL 1.4 % MT LIQD: 1.0000 | OROMUCOSAL | Status: DC | PRN

## 2020-09-04 SURGICAL SUPPLY — 51 items
BAG DECANTER FOR FLEXI CONT (MISCELLANEOUS) ×2 IMPLANT
BAND RUBBER #18 3X1/16 STRL (MISCELLANEOUS) ×4 IMPLANT
CLEANER TIP ELECTROSURG 2X2 (MISCELLANEOUS) ×2 IMPLANT
CLSR STERI-STRIP ANTIMIC 1/2X4 (GAUZE/BANDAGES/DRESSINGS) ×2 IMPLANT
CNTNR URN SCR LID CUP LEK RST (MISCELLANEOUS) ×1 IMPLANT
CONT SPEC 4OZ STRL OR WHT (MISCELLANEOUS) ×1
COVER WAND RF STERILE (DRAPES) ×2 IMPLANT
DRAPE LAPAROTOMY 100X72X124 (DRAPES) ×2 IMPLANT
DRAPE MICROSCOPE LEICA (MISCELLANEOUS) ×2 IMPLANT
DRAPE SHEET LG 3/4 BI-LAMINATE (DRAPES) ×2 IMPLANT
DRAPE SURG 17X11 SM STRL (DRAPES) ×2 IMPLANT
DRAPE UTILITY XL STRL (DRAPES) ×2 IMPLANT
DRSG AQUACEL AG ADV 3.5X 4 (GAUZE/BANDAGES/DRESSINGS) IMPLANT
DRSG AQUACEL AG ADV 3.5X 6 (GAUZE/BANDAGES/DRESSINGS) ×2 IMPLANT
DRSG TELFA 3X8 NADH (GAUZE/BANDAGES/DRESSINGS) IMPLANT
DURAPREP 26ML APPLICATOR (WOUND CARE) ×2 IMPLANT
DURASEAL SPINE SEALANT 3ML (MISCELLANEOUS) IMPLANT
ELECT BLADE 4.0 EZ CLEAN MEGAD (MISCELLANEOUS)
ELECT REM PT RETURN 9FT ADLT (ELECTROSURGICAL) ×2
ELECTRODE BLDE 4.0 EZ CLN MEGD (MISCELLANEOUS) IMPLANT
ELECTRODE REM PT RTRN 9FT ADLT (ELECTROSURGICAL) ×1 IMPLANT
GLOVE SURG SS PI 7.5 STRL IVOR (GLOVE) ×2 IMPLANT
GLOVE SURG SS PI 8.0 STRL IVOR (GLOVE) ×4 IMPLANT
GLOVE SURG UNDER POLY LF SZ7 (GLOVE) ×2 IMPLANT
GOWN STRL REUS W/ TWL LRG LVL3 (GOWN DISPOSABLE) ×1 IMPLANT
GOWN STRL REUS W/ TWL XL LVL3 (GOWN DISPOSABLE) ×1 IMPLANT
GOWN STRL REUS W/TWL LRG LVL3 (GOWN DISPOSABLE) ×1
GOWN STRL REUS W/TWL XL LVL3 (GOWN DISPOSABLE) ×1
IV CATH 14GX2 1/4 (CATHETERS) ×2 IMPLANT
KIT BASIN OR (CUSTOM PROCEDURE TRAY) ×2 IMPLANT
NEEDLE 22X1 1/2 (OR ONLY) (NEEDLE) ×2 IMPLANT
NEEDLE SPNL 18GX3.5 QUINCKE PK (NEEDLE) ×4 IMPLANT
PACK LAMINECTOMY NEURO (CUSTOM PROCEDURE TRAY) ×2 IMPLANT
PATTIES SURGICAL .75X.75 (GAUZE/BANDAGES/DRESSINGS) ×2 IMPLANT
SPONGE LAP 4X18 RFD (DISPOSABLE) IMPLANT
SPONGE SURGIFOAM ABS GEL 100 (HEMOSTASIS) ×2 IMPLANT
STAPLER VISISTAT (STAPLE) IMPLANT
STRIP CLOSURE SKIN 1/2X4 (GAUZE/BANDAGES/DRESSINGS) ×2 IMPLANT
SUT NURALON 4 0 TR CR/8 (SUTURE) IMPLANT
SUT PROLENE 3 0 PS 2 (SUTURE) IMPLANT
SUT VIC AB 1 CT1 27 (SUTURE)
SUT VIC AB 1 CT1 27XBRD ANTBC (SUTURE) IMPLANT
SUT VIC AB 1-0 CT2 27 (SUTURE) IMPLANT
SUT VIC AB 2-0 CT1 27 (SUTURE)
SUT VIC AB 2-0 CT1 TAPERPNT 27 (SUTURE) IMPLANT
SUT VIC AB 2-0 CT2 27 (SUTURE) IMPLANT
SYR 3ML LL SCALE MARK (SYRINGE) ×2 IMPLANT
TOWEL GREEN STERILE (TOWEL DISPOSABLE) ×2 IMPLANT
TOWEL GREEN STERILE FF (TOWEL DISPOSABLE) ×2 IMPLANT
TRAY FOLEY MTR SLVR 16FR STAT (SET/KITS/TRAYS/PACK) ×2 IMPLANT
YANKAUER SUCT BULB TIP NO VENT (SUCTIONS) ×2 IMPLANT

## 2020-09-04 NOTE — Anesthesia Postprocedure Evaluation (Signed)
Anesthesia Post Note  Patient: Willie Carpenter  Procedure(s) Performed: Microlumbar decompression  L5-S1 left (Left )     Patient location during evaluation: PACU Anesthesia Type: General Level of consciousness: awake and alert Pain management: pain level controlled Vital Signs Assessment: post-procedure vital signs reviewed and stable Respiratory status: spontaneous breathing, nonlabored ventilation and respiratory function stable Cardiovascular status: blood pressure returned to baseline and stable Postop Assessment: no apparent nausea or vomiting Anesthetic complications: no   No complications documented.  Last Vitals:  Vitals:   09/04/20 1515 09/04/20 1530  BP: 118/69 116/74  Pulse: 80 72  Resp: 20 13  Temp: 36.6 C   SpO2: 99% 96%    Last Pain:  Vitals:   09/04/20 1515  TempSrc:   PainSc: Asleep                 Keyosha Tiedt,W. EDMOND

## 2020-09-04 NOTE — Transfer of Care (Signed)
Immediate Anesthesia Transfer of Care Note  Patient: Willie Carpenter  Procedure(s) Performed: Microlumbar decompression  L5-S1 left (Left )  Patient Location: PACU  Anesthesia Type:General  Level of Consciousness: awake, alert  and oriented  Airway & Oxygen Therapy: Patient Spontanous Breathing  Post-op Assessment: Report given to RN, Post -op Vital signs reviewed and stable and Patient moving all extremities  Post vital signs: Reviewed and stable  Last Vitals:  Vitals Value Taken Time  BP 118/69 09/04/20 1515  Temp 36.6 C 09/04/20 1515  Pulse 82 09/04/20 1516  Resp 14 09/04/20 1516  SpO2 97 % 09/04/20 1516  Vitals shown include unvalidated device data.  Last Pain:  Vitals:   09/04/20 1109  TempSrc:   PainSc: 3          Complications: No complications documented.

## 2020-09-04 NOTE — Brief Op Note (Signed)
09/04/2020  3:04 PM  PATIENT:  Willie Carpenter  65 y.o. male  PRE-OPERATIVE DIAGNOSIS:  Herniated disc, stenosis L5-S1 left  POST-OPERATIVE DIAGNOSIS:  Herniated disc, stenosis L5-S1 left  PROCEDURE:  Procedure(s) with comments: Microlumbar decompression  L5-S1 left (Left) - 2 hrs  SURGEON:  Surgeon(s) and Role:    Jene Every, MD - Primary  PHYSICIAN ASSISTANT:   ASSISTANTS: Bissell   ANESTHESIA:   general  EBL:  min   BLOOD ADMINISTERED:none  DRAINS: none   LOCAL MEDICATIONS USED:  MARCAINE     SPECIMEN:  Source of Specimen:  L5S1  DISPOSITION OF SPECIMEN:  PATHOLOGY  COUNTS:  YES  TOURNIQUET:  * No tourniquets in log *  DICTATION: .Other Dictation: Dictation Number     [id number]  PLAN OF CARE: Admit for overnight observation  PATIENT DISPOSITION:  PACU - hemodynamically stable.   Delay start of Pharmacological VTE agent (>24hrs) due to surgical blood loss or risk of bleeding: yes

## 2020-09-04 NOTE — Anesthesia Preprocedure Evaluation (Addendum)
Anesthesia Evaluation  Patient identified by MRN, date of birth, ID band Patient awake    Reviewed: Allergy & Precautions, H&P , NPO status , Patient's Chart, lab work & pertinent test results  Airway Mallampati: III  TM Distance: >3 FB Neck ROM: Full    Dental no notable dental hx. (+) Teeth Intact, Dental Advisory Given   Pulmonary neg pulmonary ROS,    Pulmonary exam normal breath sounds clear to auscultation       Cardiovascular negative cardio ROS   Rhythm:Regular Rate:Normal     Neuro/Psych  Headaches, negative psych ROS   GI/Hepatic Neg liver ROS, GERD  Medicated,  Endo/Other  negative endocrine ROS  Renal/GU negative Renal ROS  negative genitourinary   Musculoskeletal  (+) Arthritis , Osteoarthritis,    Abdominal   Peds  Hematology negative hematology ROS (+)   Anesthesia Other Findings   Reproductive/Obstetrics negative OB ROS                            Anesthesia Physical Anesthesia Plan  ASA: II  Anesthesia Plan: General   Post-op Pain Management:    Induction: Intravenous  PONV Risk Score and Plan: 3 and Ondansetron, Dexamethasone and Midazolam  Airway Management Planned: Oral ETT  Additional Equipment:   Intra-op Plan:   Post-operative Plan: Extubation in OR  Informed Consent: I have reviewed the patients History and Physical, chart, labs and discussed the procedure including the risks, benefits and alternatives for the proposed anesthesia with the patient or authorized representative who has indicated his/her understanding and acceptance.     Dental advisory given  Plan Discussed with: CRNA  Anesthesia Plan Comments:         Anesthesia Quick Evaluation

## 2020-09-04 NOTE — Anesthesia Procedure Notes (Signed)
Procedure Name: Intubation Date/Time: 09/04/2020 1:07 PM Performed by: Darletta Moll, CRNA Pre-anesthesia Checklist: Patient identified, Emergency Drugs available, Suction available and Patient being monitored Patient Re-evaluated:Patient Re-evaluated prior to induction Oxygen Delivery Method: Circle system utilized Preoxygenation: Pre-oxygenation with 100% oxygen Induction Type: IV induction Ventilation: Mask ventilation without difficulty Laryngoscope Size: Mac and 4 Grade View: Grade I Tube type: Oral Tube size: 7.5 mm Number of attempts: 1 Airway Equipment and Method: Stylet Placement Confirmation: ETT inserted through vocal cords under direct vision,  positive ETCO2 and breath sounds checked- equal and bilateral Secured at: 22 cm Tube secured with: Tape Dental Injury: Teeth and Oropharynx as per pre-operative assessment

## 2020-09-04 NOTE — Discharge Instructions (Signed)
Walk As Tolerated utilizing back precautions.  No bending, twisting, or lifting.  No driving for 2 weeks.   Aquacel dressing may remain in place until follow up. May shower with aquacel dressing in place. If the dressing peels off or becomes saturated, you may remove aquacel dressing and place gauze and tape dressing which should be kept clean and dry and changed daily. Do not remove steri-strips if they are present. See Dr. Shelle Iron in office in 10 to 14 days. Begin taking aspirin 81mg  per day starting 4 days after your surgery if not allergic to aspirin or on another blood thinner. Walk daily even outside. Use a cane or walker only if necessary. Avoid sitting on soft sofas.    General Anesthesia, Adult, Care After This sheet gives you information about how to care for yourself after your procedure. Your health care provider may also give you more specific instructions. If you have problems or questions, contact your health care provider. What can I expect after the procedure? After the procedure, the following side effects are common:  Pain or discomfort at the IV site.  Nausea.  Vomiting.  Sore throat.  Trouble concentrating.  Feeling cold or chills.  Feeling weak or tired.  Sleepiness and fatigue.  Soreness and body aches. These side effects can affect parts of the body that were not involved in surgery. Follow these instructions at home: For the time period you were told by your health care provider:  Rest.  Do not participate in activities where you could fall or become injured.  Do not drive or use machinery.  Do not drink alcohol.  Do not take sleeping pills or medicines that cause drowsiness.  Do not make important decisions or sign legal documents.  Do not take care of children on your own.   Eating and drinking  Follow any instructions from your health care provider about eating or drinking restrictions.  When you feel hungry, start by eating small  amounts of foods that are soft and easy to digest (bland), such as toast. Gradually return to your regular diet.  Drink enough fluid to keep your urine pale yellow.  If you vomit, rehydrate by drinking water, juice, or clear broth. General instructions  If you have sleep apnea, surgery and certain medicines can increase your risk for breathing problems. Follow instructions from your health care provider about wearing your sleep device: ? Anytime you are sleeping, including during daytime naps. ? While taking prescription pain medicines, sleeping medicines, or medicines that make you drowsy.  Have a responsible adult stay with you for the time you are told. It is important to have someone help care for you until you are awake and alert.  Return to your normal activities as told by your health care provider. Ask your health care provider what activities are safe for you.  Take over-the-counter and prescription medicines only as told by your health care provider.  If you smoke, do not smoke without supervision.  Keep all follow-up visits as told by your health care provider. This is important. Contact a health care provider if:  You have nausea or vomiting that does not get better with medicine.  You cannot eat or drink without vomiting.  You have pain that does not get better with medicine.  You are unable to pass urine.  You develop a skin rash.  You have a fever.  You have redness around your IV site that gets worse. Get help right away if:  You have difficulty breathing.  You have chest pain.  You have blood in your urine or stool, or you vomit blood. Summary  After the procedure, it is common to have a sore throat or nausea. It is also common to feel tired.  Have a responsible adult stay with you for the time you are told. It is important to have someone help care for you until you are awake and alert.  When you feel hungry, start by eating small amounts of foods  that are soft and easy to digest (bland), such as toast. Gradually return to your regular diet.  Drink enough fluid to keep your urine pale yellow.  Return to your normal activities as told by your health care provider. Ask your health care provider what activities are safe for you. This information is not intended to replace advice given to you by your health care provider. Make sure you discuss any questions you have with your health care provider. Document Revised: 01/23/2020 Document Reviewed: 08/22/2019 Elsevier Patient Education  2021 [employer].

## 2020-09-04 NOTE — Interval H&P Note (Signed)
History and Physical Interval Note:  09/04/2020 12:21 PM  Willie Carpenter  has presented today for surgery, with the diagnosis of Herniated disc, stenosis L5-S1 left.  The various methods of treatment have been discussed with the patient and family. After consideration of risks, benefits and other options for treatment, the patient has consented to  Procedure(s) with comments: Microlumbar decompression  L5-S1 left (Left) - 2 hrs as a surgical intervention.  The patient's history has been reviewed, patient examined, no change in status, stable for surgery.  I have reviewed the patient's chart and labs.  Questions were answered to the patient's satisfaction.     Javier Docker

## 2020-09-05 ENCOUNTER — Encounter (HOSPITAL_COMMUNITY): Payer: Self-pay | Admitting: Specialist

## 2020-09-05 NOTE — Op Note (Signed)
NAMECORBET, HANLEY MEDICAL RECORD NO: [id number] ACCOUNT NO: [id number] DATE OF BIRTH: 1956-01-11 FACILITY: [facility] LOCATION: [facility] PHYSICIAN: Javier Docker, MD  Operative Report   DATE OF PROCEDURE: 09/04/2020  PREOPERATIVE DIAGNOSIS:  Spinal stenosis, HNP, L5-S1, left.  POSTOPERATIVE DIAGNOSIS:  Spinal stenosis HNP, L5-S1, left.  PROCEDURE PERFORMED:   1.  Microlumbar decompression, L5-S1, left. 2.  Foraminotomy S1 left. 3.  Microdiskectomy L5-S1, left.  ANESTHESIA:  General.  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  Risks and benefits discussed including bleeding, infection, damage to neurovascular structures, no change in symptoms, worsening symptoms, DVT, PE, anesthetic complications, etc.  DESCRIPTION OF PROCEDURE:  With the patient in supine position after induction of adequate general anesthesia, 2 grams Kefzol, he was placed prone on the Wilson frame.  Foley to gravity.  All bony prominences were well padded.  Lumbar region was prepped  and draped in the usual sterile fashion.  Two 18-gauge spinal needle was utilized to localize the L5-S1 interspace, confirmed with x-ray.  Incision was made from the spinous process of L5-S1.  Subcutaneous tissue was dissected, electrocautery was  utilized to achieve hemostasis.  Dorsal lumbar fascia divided in line with skin incision.  Paraspinous muscle elevated from the lamina 5-1.  McCulloch retractor was placed.  Confirmatory radiograph obtained at L5-S1.  The patient had a fairly vertical  inclination to his lamina of S1.  I performed a hemilaminotomy at caudad edge of 5.  I detached ligamentum flavum from the cephalad edge of S1 utilizing a  micro curette, used the Penfield 4 to enter the epidural space and protecting the neural elements.   I then performed a foraminotomy of S1, identifying the S1 nerve root. Gently mobilizing it medially.  I removed some ligamentum flavum from the interspace.  I then decompressed the lateral recess of the medial border of the pedicle.  There was noted  epidural venous plexus tethering the S1 nerve root and epidural fat noted as well.  I cauterized and lysed these epidural veins, which mobilized the S1 nerve root medially.  I then found a focal disk herniation, L5-S1.  This had been compressed into the  lateral recess.  With a neural element well protected, I performed an annulotomy and removed multiple fragments with a micropituitary and a nerve hook.  I then irrigated the disk space with catheter irrigation expressing an additional fragment.  This was  removed as well.  Following this, I inspected the nerve root at the shoulder of the root, the axilla of the root and the foramen of L5 and S1.  No evidence of additional disk herniation.  I had 1 cm of excursion of the S1 nerve root medially of the  pedicle without tension.  Then, the [facility] probe passed freely out the foramen of L5 and S1.  Inspection revealed no evidence of CSF leakage or active bleeding.  Epidural fat was draped over the S1 nerve root.  It was intact, erythematous and edematous  though.  I then placed bone wax in the cancellous surfaces.  A confirmatory radiograph obtained confirming L5-S1. Then removed the [facility] retractor.  Paraspinous muscles inspected and no evidence of active bleeding.  Copiously irrigated the  paraspinous musculature.  No evidence of active  bleeding or CSF leakage.  I then reapproximated the dorsal lumbar fascia with #1 Vicryl in interrupted figure-of-eight sutures, subcutaneous with 2-0 and skin with Prolene.  Sterile dressing was applied.   She was placed supine on the hospital bed, extubated without difficulty  and transported to the recovery room in satisfactory condition.  The patient tolerated the procedure well.  No complications.   ASSISTANT:  Andrez Grime, PA was used throughout the case for general intermittent neural retraction, closure, patient positioning.   SUJ D: 09/04/2020 3:10:17 pm T: 09/05/2020 3:57:00 am  JOB: [id number]/ [id number]

## 2020-09-07 LAB — SURGICAL PATHOLOGY

## 2021-02-02 ENCOUNTER — Other Ambulatory Visit: Payer: Self-pay | Admitting: Family Medicine

## 2021-02-02 ENCOUNTER — Other Ambulatory Visit: Payer: Self-pay

## 2021-02-02 MED ORDER — FINASTERIDE 5 MG PO TABS: 5.0000 mg | ORAL_TABLET | Freq: Every day | ORAL | 0 refills | Status: DC

## 2021-02-02 MED ORDER — FAMOTIDINE 40 MG PO TABS: 40.0000 mg | ORAL_TABLET | Freq: Every day | ORAL | 0 refills | Status: DC

## 2021-03-01 ENCOUNTER — Other Ambulatory Visit: Payer: Self-pay

## 2021-03-01 ENCOUNTER — Encounter: Payer: Self-pay | Admitting: Family Medicine

## 2021-03-01 ENCOUNTER — Ambulatory Visit: Payer: BC Managed Care – PPO | Admitting: Family Medicine

## 2021-03-01 VITALS — BP 110/72 | HR 72 | Temp 98.4°F | Wt 194.0 lb

## 2021-03-01 DIAGNOSIS — G43909 Migraine, unspecified, not intractable, without status migrainosus: Secondary | ICD-10-CM

## 2021-03-01 DIAGNOSIS — N138 Other obstructive and reflux uropathy: Secondary | ICD-10-CM

## 2021-03-01 DIAGNOSIS — G8929 Other chronic pain: Secondary | ICD-10-CM

## 2021-03-01 DIAGNOSIS — M545 Low back pain, unspecified: Secondary | ICD-10-CM

## 2021-03-01 DIAGNOSIS — Z0000 Encounter for general adult medical examination without abnormal findings: Secondary | ICD-10-CM

## 2021-03-01 DIAGNOSIS — N401 Enlarged prostate with lower urinary tract symptoms: Secondary | ICD-10-CM

## 2021-03-01 DIAGNOSIS — N644 Mastodynia: Secondary | ICD-10-CM | POA: Diagnosis not present

## 2021-03-01 MED ORDER — FAMOTIDINE 40 MG PO TABS: 40.0000 mg | ORAL_TABLET | Freq: Every day | ORAL | 3 refills | Status: DC

## 2021-03-01 MED ORDER — GABAPENTIN 100 MG PO CAPS: 100.0000 mg | ORAL_CAPSULE | Freq: Three times a day (TID) | ORAL | 5 refills | Status: DC

## 2021-03-01 MED ORDER — RIZATRIPTAN BENZOATE 10 MG PO TABS: 10.0000 mg | ORAL_TABLET | ORAL | 5 refills | Status: DC | PRN

## 2021-03-01 NOTE — Progress Notes (Signed)
   Subjective:    Patient ID: Willie Carpenter, male    DOB: [birth year], 65 y.o.   MRN: [id number]  HPI Here for screening labs and with some questions. For the past 2 months he has tenderness in both nipples. No swelling or lumps. Of note he has been taking Finasteride for about 2 years. His headaches and back pain are stable.    Review of Systems  Constitutional: Negative.   Respiratory: Negative.    Cardiovascular: Negative.   Musculoskeletal:  Positive for back pain.      Objective:   Physical Exam Constitutional:      Appearance: Normal appearance.  Cardiovascular:     Rate and Rhythm: Normal rate and regular rhythm.     Pulses: Normal pulses.     Heart sounds: Normal heart sounds.  Pulmonary:     Effort: Pulmonary effort is normal.     Breath sounds: Normal breath sounds.     Comments: Both nipples appear normal but they are quite tender. No breast lumps or enlargement  Neurological:     Mental Status: He is alert.          Assessment & Plan:  His back pain and migraines are stable. His nipple tenderness is likely a side effect of the Finasteride, so we agreed to stop this. He will stay on the Tamsulosin. He will return fasting soon to get screening labs.  Gershon Crane, MD

## 2021-03-02 ENCOUNTER — Other Ambulatory Visit (INDEPENDENT_AMBULATORY_CARE_PROVIDER_SITE_OTHER): Payer: BC Managed Care – PPO

## 2021-03-02 DIAGNOSIS — Z0000 Encounter for general adult medical examination without abnormal findings: Secondary | ICD-10-CM

## 2021-03-02 LAB — BASIC METABOLIC PANEL
BUN: 16 mg/dL (ref 6–23)
CO2: 25 mEq/L (ref 19–32)
Calcium: 9.2 mg/dL (ref 8.4–10.5)
Chloride: 103 mEq/L (ref 96–112)
Creatinine, Ser: 0.91 mg/dL (ref 0.40–1.50)
GFR: 89.88 mL/min (ref >60.00)
Glucose, Bld: 107 mg/dL — ABNORMAL HIGH (ref 70–99)
Potassium: 4.2 mEq/L (ref 3.5–5.1)
Sodium: 136 mEq/L (ref 135–145)

## 2021-03-02 LAB — CBC WITH DIFFERENTIAL/PLATELET
Basophils Absolute: 0 10*3/uL (ref 0.0–0.1)
Basophils Relative: 0.9 % (ref 0.0–3.0)
Eosinophils Absolute: 0.1 10*3/uL (ref 0.0–0.7)
Eosinophils Relative: 2.9 % (ref 0.0–5.0)
HCT: 39.6 % (ref 39.0–52.0)
Hemoglobin: 13.5 g/dL (ref 13.0–17.0)
Lymphocytes Relative: 39.8 % (ref 12.0–46.0)
Lymphs Abs: 1.5 10*3/uL (ref 0.7–4.0)
MCHC: 34.1 g/dL (ref 30.0–36.0)
MCV: 90.2 fl (ref 78.0–100.0)
Monocytes Absolute: 0.6 10*3/uL (ref 0.1–1.0)
Monocytes Relative: 17.2 % — ABNORMAL HIGH (ref 3.0–12.0)
Neutro Abs: 1.5 10*3/uL (ref 1.4–7.7)
Neutrophils Relative %: 39.2 % — ABNORMAL LOW (ref 43.0–77.0)
Platelets: 244 10*3/uL (ref 150.0–400.0)
RBC: 4.39 Mil/uL (ref 4.22–5.81)
RDW: 13.1 % (ref 11.5–15.5)
WBC: 3.8 10*3/uL — ABNORMAL LOW (ref 4.0–10.5)

## 2021-03-02 LAB — LIPID PANEL
Cholesterol: 179 mg/dL (ref 0–200)
HDL: 49.9 mg/dL (ref >39.00)
LDL Cholesterol: 108 mg/dL — ABNORMAL HIGH (ref 0–99)
NonHDL: 129.24
Total CHOL/HDL Ratio: 4
Triglycerides: 107 mg/dL (ref 0.0–149.0)
VLDL: 21.4 mg/dL (ref 0.0–40.0)

## 2021-03-02 LAB — TESTOSTERONE: Testosterone: 273.83 ng/dL — ABNORMAL LOW (ref 300.00–890.00)

## 2021-03-02 LAB — TSH: TSH: 2.11 u[IU]/mL (ref 0.35–5.50)

## 2021-03-02 LAB — HEPATIC FUNCTION PANEL
ALT: 27 U/L (ref 0–53)
AST: 27 U/L (ref 0–37)
Albumin: 4.2 g/dL (ref 3.5–5.2)
Alkaline Phosphatase: 36 U/L — ABNORMAL LOW (ref 39–117)
Bilirubin, Direct: 0.1 mg/dL (ref 0.0–0.3)
Total Bilirubin: 0.4 mg/dL (ref 0.2–1.2)
Total Protein: 6.3 g/dL (ref 6.0–8.3)

## 2021-03-02 LAB — HEMOGLOBIN A1C: Hgb A1c MFr Bld: 5.7 % (ref 4.6–6.5)

## 2021-03-02 LAB — PSA: PSA: 0.16 ng/mL (ref 0.10–4.00)

## 2021-06-02 ENCOUNTER — Other Ambulatory Visit: Payer: Self-pay | Admitting: Family Medicine

## 2021-06-11 ENCOUNTER — Other Ambulatory Visit: Payer: Self-pay | Admitting: Family Medicine

## 2021-06-30 ENCOUNTER — Other Ambulatory Visit: Payer: Self-pay | Admitting: Family Medicine

## 2021-07-01 ENCOUNTER — Encounter: Payer: Self-pay | Admitting: Family Medicine

## 2021-07-02 ENCOUNTER — Other Ambulatory Visit: Payer: Self-pay

## 2021-07-02 DIAGNOSIS — E785 Hyperlipidemia, unspecified: Secondary | ICD-10-CM

## 2021-07-02 MED ORDER — ATORVASTATIN CALCIUM 20 MG PO TABS: 20.0000 mg | ORAL_TABLET | Freq: Every day | ORAL | 3 refills | Status: DC

## 2021-07-02 NOTE — Telephone Encounter (Signed)
Call in Atorvastatin 20 mg daily, #90 with 3 rf

## 2021-08-06 ENCOUNTER — Encounter: Payer: Self-pay | Admitting: Family Medicine

## 2021-08-06 MED ORDER — TOBRAMYCIN-DEXAMETHASONE 0.3-0.1 % OP SUSP: 2.0000 [drp] | OPHTHALMIC | 0 refills | Status: DC

## 2021-08-06 NOTE — Telephone Encounter (Signed)
I sent in for Tobradex eye drops  ?

## 2021-09-05 ENCOUNTER — Other Ambulatory Visit: Payer: Self-pay | Admitting: Family Medicine

## 2021-09-05 DIAGNOSIS — N401 Enlarged prostate with lower urinary tract symptoms: Secondary | ICD-10-CM

## 2021-11-30 ENCOUNTER — Ambulatory Visit (INDEPENDENT_AMBULATORY_CARE_PROVIDER_SITE_OTHER): Payer: BC Managed Care – PPO | Admitting: Family Medicine

## 2021-11-30 ENCOUNTER — Encounter: Payer: Self-pay | Admitting: Family Medicine

## 2021-11-30 VITALS — BP 118/80 | HR 68 | Temp 98.6°F | Ht 69.5 in | Wt 193.0 lb

## 2021-11-30 DIAGNOSIS — E785 Hyperlipidemia, unspecified: Secondary | ICD-10-CM

## 2021-11-30 DIAGNOSIS — Z0000 Encounter for general adult medical examination without abnormal findings: Secondary | ICD-10-CM

## 2021-11-30 DIAGNOSIS — N138 Other obstructive and reflux uropathy: Secondary | ICD-10-CM

## 2021-11-30 DIAGNOSIS — N401 Enlarged prostate with lower urinary tract symptoms: Secondary | ICD-10-CM | POA: Diagnosis not present

## 2021-11-30 DIAGNOSIS — R739 Hyperglycemia, unspecified: Secondary | ICD-10-CM | POA: Diagnosis not present

## 2021-11-30 DIAGNOSIS — E291 Testicular hypofunction: Secondary | ICD-10-CM | POA: Diagnosis not present

## 2021-11-30 MED ORDER — KETOCONAZOLE 2 % EX CREA: 1.0000 | TOPICAL_CREAM | Freq: Two times a day (BID) | CUTANEOUS | 5 refills | Status: DC | PRN

## 2021-11-30 MED ORDER — TAMSULOSIN HCL 0.4 MG PO CAPS: 0.8000 mg | ORAL_CAPSULE | Freq: Every day | ORAL | 3 refills | Status: DC

## 2021-11-30 NOTE — Progress Notes (Signed)
Subjective:    Patient ID: Willie Carpenter, male    DOB: [birth year], 66 y.o.   MRN: [id number]  HPI Here for a well exam. He has a few issues to discuss. First he has noticed a drop in libido and some mild erection difficulties lately, and he wants to check a testosterone level. Also he has had 4 weeks of an itchy rash on the scrotum. He has been applying Lotrimen OTC with no effect. Otherwise he has been seeing Dr. Rodolph Bong for bilateral shoulder pain, and he has had several cortisone injections in both shoulders.    Review of Systems  Constitutional: Negative.   HENT: Negative.    Eyes: Negative.   Respiratory: Negative.    Cardiovascular: Negative.   Gastrointestinal: Negative.   Genitourinary: Negative.   Musculoskeletal:  Positive for arthralgias.  Skin:  Positive for rash.  Neurological: Negative.   Psychiatric/Behavioral: Negative.         Objective:   Physical Exam Constitutional:      General: He is not in acute distress.    Appearance: Normal appearance. He is well-developed. He is not diaphoretic.  HENT:     Head: Normocephalic and atraumatic.     Right Ear: External ear normal.     Left Ear: External ear normal.     Nose: Nose normal.     Mouth/Throat:     Pharynx: No oropharyngeal exudate.  Eyes:     General: No scleral icterus.       Right eye: No discharge.        Left eye: No discharge.     Conjunctiva/sclera: Conjunctivae normal.     Pupils: Pupils are equal, round, and reactive to light.  Neck:     Thyroid: No thyromegaly.     Vascular: No JVD.     Trachea: No tracheal deviation.  Cardiovascular:     Rate and Rhythm: Normal rate and regular rhythm.     Heart sounds: Normal heart sounds. No murmur heard.    No friction rub. No gallop.  Pulmonary:     Effort: Pulmonary effort is normal. No respiratory distress.     Breath sounds: Normal breath sounds. No wheezing or rales.  Chest:     Chest wall: No tenderness.  Abdominal:     General:  Bowel sounds are normal. There is no distension.     Palpations: Abdomen is soft. There is no mass.     Tenderness: There is no abdominal tenderness. There is no guarding or rebound.  Genitourinary:    Penis: Normal. No tenderness.      Testes: Normal.     Prostate: Normal.     Rectum: Normal. Guaiac result negative.     Comments: There is a macular red rash on the scrotum  Musculoskeletal:        General: No tenderness. Normal range of motion.     Cervical back: Neck supple.  Lymphadenopathy:     Cervical: No cervical adenopathy.  Skin:    General: Skin is warm and dry.     Coloration: Skin is not pale.     Findings: No erythema or rash.  Neurological:     Mental Status: He is alert and oriented to person, place, and time.     Cranial Nerves: No cranial nerve deficit.     Motor: No abnormal muscle tone.     Coordination: Coordination normal.     Deep Tendon Reflexes: Reflexes are normal and symmetric. Reflexes normal.  Psychiatric:        Behavior: Behavior normal.        Thought Content: Thought content normal.        Judgment: Judgment normal.           Assessment & Plan:  Well exam. We discussed diet and exercise. Get fasting labs to check lipids, etc. We will also check a testosterone level. Treat the tinea cruris with Ketoconazole cream.  Gershon Crane, MD

## 2021-12-01 LAB — CBC WITH DIFFERENTIAL/PLATELET
Basophils Absolute: 0 10*3/uL (ref 0.0–0.1)
Basophils Relative: 0.6 % (ref 0.0–3.0)
Eosinophils Absolute: 0 10*3/uL (ref 0.0–0.7)
Eosinophils Relative: 0.2 % (ref 0.0–5.0)
HCT: 43.4 % (ref 39.0–52.0)
Hemoglobin: 14.8 g/dL (ref 13.0–17.0)
Lymphocytes Relative: 12.4 % (ref 12.0–46.0)
Lymphs Abs: 1.1 10*3/uL (ref 0.7–4.0)
MCHC: 34.1 g/dL (ref 30.0–36.0)
MCV: 90.5 fl (ref 78.0–100.0)
Monocytes Absolute: 0.7 10*3/uL (ref 0.1–1.0)
Monocytes Relative: 7.6 % (ref 3.0–12.0)
Neutro Abs: 6.9 10*3/uL (ref 1.4–7.7)
Neutrophils Relative %: 79.2 % — ABNORMAL HIGH (ref 43.0–77.0)
Platelets: 293 10*3/uL (ref 150.0–400.0)
RBC: 4.8 Mil/uL (ref 4.22–5.81)
RDW: 13.6 % (ref 11.5–15.5)
WBC: 8.7 10*3/uL (ref 4.0–10.5)

## 2021-12-01 LAB — HEPATIC FUNCTION PANEL
ALT: 23 U/L (ref 0–53)
AST: 25 U/L (ref 0–37)
Albumin: 5 g/dL (ref 3.5–5.2)
Alkaline Phosphatase: 44 U/L (ref 39–117)
Bilirubin, Direct: 0.2 mg/dL (ref 0.0–0.3)
Total Bilirubin: 0.9 mg/dL (ref 0.2–1.2)
Total Protein: 7.4 g/dL (ref 6.0–8.3)

## 2021-12-01 LAB — LIPID PANEL
Cholesterol: 211 mg/dL — ABNORMAL HIGH (ref 0–200)
HDL: 76 mg/dL (ref >39.00)
LDL Cholesterol: 116 mg/dL — ABNORMAL HIGH (ref 0–99)
NonHDL: 135.18
Total CHOL/HDL Ratio: 3
Triglycerides: 95 mg/dL (ref 0.0–149.0)
VLDL: 19 mg/dL (ref 0.0–40.0)

## 2021-12-01 LAB — BASIC METABOLIC PANEL
BUN: 19 mg/dL (ref 6–23)
CO2: 27 mEq/L (ref 19–32)
Calcium: 10.1 mg/dL (ref 8.4–10.5)
Chloride: 97 mEq/L (ref 96–112)
Creatinine, Ser: 1 mg/dL (ref 0.40–1.50)
GFR: 79.84 mL/min (ref >60.00)
Glucose, Bld: 93 mg/dL (ref 70–99)
Potassium: 4.2 mEq/L (ref 3.5–5.1)
Sodium: 134 mEq/L — ABNORMAL LOW (ref 135–145)

## 2021-12-01 LAB — HEMOGLOBIN A1C: Hgb A1c MFr Bld: 5.9 % (ref 4.6–6.5)

## 2021-12-01 LAB — PSA: PSA: 0.41 ng/mL (ref 0.10–4.00)

## 2021-12-01 LAB — TSH: TSH: 1.61 u[IU]/mL (ref 0.35–5.50)

## 2021-12-01 LAB — TESTOSTERONE: Testosterone: 351.73 ng/dL (ref 300.00–890.00)

## 2021-12-02 ENCOUNTER — Encounter: Payer: Self-pay | Admitting: Family Medicine

## 2021-12-02 NOTE — Telephone Encounter (Signed)
There are different types of white blood cells and there are normal percentages of these types. His neutrophils were very slightly (only 2%) higher in the mix than the other cells types. This is a normal variation, and absolutely nothing to worry about

## 2022-03-25 ENCOUNTER — Other Ambulatory Visit: Payer: Self-pay | Admitting: Family Medicine

## 2022-03-31 ENCOUNTER — Encounter: Payer: Self-pay | Admitting: Family Medicine

## 2022-04-01 ENCOUNTER — Other Ambulatory Visit: Payer: Self-pay

## 2022-04-01 DIAGNOSIS — G43909 Migraine, unspecified, not intractable, without status migrainosus: Secondary | ICD-10-CM

## 2022-04-01 MED ORDER — RIZATRIPTAN BENZOATE 10 MG PO TABS: 10.0000 mg | ORAL_TABLET | ORAL | 2 refills | Status: DC | PRN

## 2022-05-03 ENCOUNTER — Other Ambulatory Visit: Payer: Self-pay | Admitting: Family Medicine

## 2022-05-05 ENCOUNTER — Encounter: Payer: Self-pay | Admitting: Family Medicine

## 2022-05-06 ENCOUNTER — Other Ambulatory Visit: Payer: Self-pay

## 2022-05-06 DIAGNOSIS — E785 Hyperlipidemia, unspecified: Secondary | ICD-10-CM

## 2022-05-06 MED ORDER — ATORVASTATIN CALCIUM 20 MG PO TABS: 20.0000 mg | ORAL_TABLET | Freq: Every day | ORAL | 1 refills | Status: DC

## 2022-05-21 ENCOUNTER — Other Ambulatory Visit: Payer: Self-pay | Admitting: Family Medicine

## 2022-09-27 ENCOUNTER — Other Ambulatory Visit: Payer: Self-pay | Admitting: Family Medicine

## 2022-11-02 ENCOUNTER — Other Ambulatory Visit: Payer: Self-pay | Admitting: Family Medicine

## 2022-11-02 ENCOUNTER — Other Ambulatory Visit: Payer: Self-pay

## 2022-11-02 DIAGNOSIS — N401 Enlarged prostate with lower urinary tract symptoms: Secondary | ICD-10-CM

## 2022-11-02 NOTE — Telephone Encounter (Signed)
Pt called to F/U on this refill request  (tamsulosin (FLOMAX) 0.4 MG CAPS capsule), stating he is completely out and will be traveling tomorrow.  Please advise.   [id number] [place], [place] - [address] AT [facility] [address] Phone: [phone]  Fax: [phone]

## 2022-11-13 ENCOUNTER — Other Ambulatory Visit: Payer: Self-pay | Admitting: Family Medicine

## 2022-11-13 DIAGNOSIS — E785 Hyperlipidemia, unspecified: Secondary | ICD-10-CM

## 2023-01-10 ENCOUNTER — Encounter: Payer: Self-pay | Admitting: Family Medicine

## 2023-01-16 ENCOUNTER — Encounter: Payer: Self-pay | Admitting: Family Medicine

## 2023-01-16 ENCOUNTER — Ambulatory Visit: Payer: BC Managed Care – PPO | Admitting: Family Medicine

## 2023-01-16 VITALS — BP 110/78 | HR 68 | Temp 98.0°F | Wt 189.0 lb

## 2023-01-16 DIAGNOSIS — N401 Enlarged prostate with lower urinary tract symptoms: Secondary | ICD-10-CM

## 2023-01-16 DIAGNOSIS — E785 Hyperlipidemia, unspecified: Secondary | ICD-10-CM | POA: Diagnosis not present

## 2023-01-16 DIAGNOSIS — K580 Irritable bowel syndrome with diarrhea: Secondary | ICD-10-CM

## 2023-01-16 DIAGNOSIS — G43909 Migraine, unspecified, not intractable, without status migrainosus: Secondary | ICD-10-CM

## 2023-01-16 DIAGNOSIS — N138 Other obstructive and reflux uropathy: Secondary | ICD-10-CM

## 2023-01-16 MED ORDER — RIZATRIPTAN BENZOATE 10 MG PO TABS
10.0000 mg | ORAL_TABLET | ORAL | 3 refills | Status: DC | PRN
Start: 2023-01-16 — End: 2024-01-01

## 2023-01-16 MED ORDER — TAMSULOSIN HCL 0.4 MG PO CAPS: 0.8000 mg | ORAL_CAPSULE | Freq: Every day | ORAL | 3 refills | Status: DC

## 2023-01-16 MED ORDER — FAMOTIDINE 40 MG PO TABS: 40.0000 mg | ORAL_TABLET | Freq: Every day | ORAL | 3 refills | Status: DC

## 2023-01-16 MED ORDER — GABAPENTIN 100 MG PO CAPS: 100.0000 mg | ORAL_CAPSULE | Freq: Three times a day (TID) | ORAL | 3 refills | Status: DC

## 2023-01-16 MED ORDER — ATORVASTATIN CALCIUM 20 MG PO TABS
20.0000 mg | ORAL_TABLET | Freq: Every day | ORAL | 3 refills | Status: DC
Start: 2023-01-16 — End: 2024-01-02

## 2023-01-16 NOTE — Progress Notes (Signed)
   Subjective:    Patient ID: Willie Carpenter, male    DOB: Mar 29, 1956, 66 y.o.   MRN: [id number]  HPI Here for frequent diffuse abdominal cramps and loose stools. These started several years ago, but they have gotten worse in the past few months. He is never constipated. No urinary symptoms. He knows that certain foods , like spicy or fatty foods, will make this worse. No nausea or fever. He takes Famotidine daily. He typically drinks a cup of very strong tea every morning, followed by 1 or 2 strong latte drinks .   Review of Systems  Constitutional: Negative.   Respiratory: Negative.    Cardiovascular: Negative.   Gastrointestinal:  Positive for abdominal pain and diarrhea. Negative for abdominal distention, blood in stool, constipation, nausea and vomiting.  Genitourinary: Negative.        Objective:   Physical Exam Constitutional:      General: He is not in acute distress.    Appearance: Normal appearance.  Cardiovascular:     Rate and Rhythm: Normal rate and regular rhythm.     Pulses: Normal pulses.     Heart sounds: Normal heart sounds.  Pulmonary:     Effort: Pulmonary effort is normal.     Breath sounds: Normal breath sounds.  Abdominal:     General: Abdomen is flat. Bowel sounds are normal. There is no distension.     Palpations: Abdomen is soft. There is no mass.     Tenderness: There is no abdominal tenderness. There is no guarding or rebound.     Hernia: No hernia is present.  Neurological:     Mental Status: He is alert.           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 I advised him to decrease his daily caffeine intake. Recheck as needed.  Gershon Crane, MD

## 2023-04-18 ENCOUNTER — Ambulatory Visit: Payer: BC Managed Care – PPO | Admitting: Family Medicine

## 2023-04-18 ENCOUNTER — Encounter: Payer: Self-pay | Admitting: Family Medicine

## 2023-04-18 VITALS — BP 120/76 | HR 63 | Temp 98.6°F | Wt 193.4 lb

## 2023-04-18 DIAGNOSIS — G43909 Migraine, unspecified, not intractable, without status migrainosus: Secondary | ICD-10-CM

## 2023-04-18 MED ORDER — GABAPENTIN 100 MG PO CAPS: 100.0000 mg | ORAL_CAPSULE | ORAL | Status: DC | PRN

## 2023-04-18 NOTE — Progress Notes (Signed)
   Subjective:    Patient ID: Willie Carpenter, male    DOB: 1955-11-02, 67 y.o.   MRN: [id number]  HPI Here to ask for a referral to a Neurologist for his migraines. He used to average 2-3 of these per year, but they have steadily become more frequent. His now averaging one migraine per week. These are always preceded by auras where his peripheral vision becomes blurry and he sees floaters. He usually gets relief by taking Rizatriptan.    Review of Systems  Constitutional: Negative.   Respiratory: Negative.    Cardiovascular: Negative.   Neurological:  Positive for headaches.       Objective:   Physical Exam Constitutional:      Appearance: Normal appearance.  Cardiovascular:     Rate and Rhythm: Normal rate and regular rhythm.     Pulses: Normal pulses.     Heart sounds: Normal heart sounds.  Pulmonary:     Effort: Pulmonary effort is normal.     Breath sounds: Normal breath sounds.  Neurological:     General: No focal deficit present.     Mental Status: He is alert and oriented to person, place, and time.           Assessment & Plan:  Migraines. Per his request we will refer him to see Dr. Joycelyn Schmid in [facility].  Gershon Crane, MD

## 2023-04-25 ENCOUNTER — Ambulatory Visit: Payer: BC Managed Care – PPO | Admitting: Diagnostic Neuroimaging

## 2023-04-25 ENCOUNTER — Encounter: Payer: Self-pay | Admitting: Diagnostic Neuroimaging

## 2023-04-25 VITALS — BP 124/65 | HR 77 | Ht 70.0 in | Wt 194.0 lb

## 2023-04-25 DIAGNOSIS — R519 Headache, unspecified: Secondary | ICD-10-CM

## 2023-04-25 MED ORDER — NURTEC 75 MG PO TBDP: 75.0000 mg | ORAL_TABLET | Freq: Every day | ORAL | 6 refills | Status: DC | PRN

## 2023-04-25 NOTE — Progress Notes (Signed)
[facility]  PATIENT: Willie Carpenter DOB: 27-Feb-1956  REFERRING CLINICIAN: Nelwyn Salisbury, MD HISTORY FROM: patient  REASON FOR VISIT: new consult   HISTORICAL  CHIEF COMPLAINT:  Chief Complaint  Patient presents with   New Patient (Initial Visit)    Pt in 7 Pt here for Migraines Pt states 3 migraines in last night Pt states gets a aura and losses  Peripheral vision     HISTORY OF PRESENT ILLNESS:   67 year old male here for evaluation of headaches.  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  Developed some headaches around the 1990s.  Headache can consist of frontal, heavy, squeezing pain associate with photophobia, phonophobia.  No nausea or vomiting.  Sometimes sees some floaters.  Sometimes has visual scotoma and loses peripheral vision.  Headaches have been happening once or twice a year.  However in the last year have increasing frequency.  Has had 9 headaches in the past year including for the last month.  Also noticing some changes in fine motor skills.  Triggering factors may include red wine and stress.  Family history of migraine in father.  Has tried sumatriptan, rizatriptan and Tylenol with mild relief.   REVIEW OF SYSTEMS: Full 14 system review of systems performed and negative with exception of: as per HPI.  ALLERGIES: Allergies  Allergen Reactions   Erythromycin     Stomach pain    HOME MEDICATIONS: Outpatient Medications Prior to Visit  Medication Sig Dispense Refill   acetaminophen (TYLENOL) 500 MG tablet Take 1,000 mg by mouth every 8 (eight) hours as needed for moderate pain or headache.     ALPHA LIPOIC ACID PO Take by mouth.     atorvastatin (LIPITOR) 20 MG tablet Take 1 tablet (20 mg total) by mouth daily. 90 tablet 3   B Complex Vitamins (B COMPLEX PO) Take by mouth.     cetirizine (ZYRTEC) 10 MG tablet Take 10 mg by mouth daily as needed for allergies.     famotidine (PEPCID) 40 MG tablet Take  1 tablet (40 mg total) by mouth daily. 90 tablet 3   gabapentin (NEURONTIN) 100 MG capsule Take 1 capsule (100 mg total) by mouth as needed.     ketoconazole (NIZORAL) 2 % cream Apply 1 Application topically 2 (two) times daily as needed for irritation. 30 g 5   Omega-3 Fatty Acids (OMEGA 3 PO) Take by mouth.     rizatriptan (MAXALT) 10 MG tablet Take 1 tablet (10 mg total) by mouth as needed for migraine. May repeat in 2 hours if needed 30 tablet 3   tamsulosin (FLOMAX) 0.4 MG CAPS capsule Take 2 capsules (0.8 mg total) by mouth daily. 180 capsule 3   polyethylene glycol (MIRALAX / GLYCOLAX) 17 g packet Take 17 g by mouth daily. 14 each 0   No facility-administered medications prior to visit.    PAST MEDICAL HISTORY: Past Medical History:  Diagnosis Date   Allergy    Crossed renal ectopia    left   GERD (gastroesophageal reflux disease)    diet related- PRN zantac OTC   Headache    Hyperlipidemia    Postherpetic neuralgia    Sciatica, left side     PAST SURGICAL HISTORY: Past Surgical History:  Procedure Laterality Date   COLONOSCOPY  08/03/2017   per Dr. Leone Payor, sigmoid diverticulosis, no polyps, repeat in 10 yrs    EPIDURAL BLOCK INJECTION  Feb 2008 and May 2008  lumbar, in [place]    HERNIA REPAIR     inguinal hernia repair   KNEE SURGERY     key hole surgery 2016   LUMBAR DISC SURGERY  2007   in [place], at L5-S1    LUMBAR LAMINECTOMY/DECOMPRESSION MICRODISCECTOMY Left 09/04/2020   Procedure: Microlumbar decompression  L5-S1 left;  Surgeon: Jene Every, MD;  Location: [facility] OR;  Service: Orthopedics;  Laterality: Left;  2 hrs   RHINOPLASTY     SPINE SURGERY     TONSILLECTOMY AND ADENOIDECTOMY  1972    FAMILY HISTORY: Family History  Problem Relation Age of Onset   Arthritis Mother    Arthritis Father    Heart disease Father    Hypertension Father    Hyperlipidemia Father    Migraines Father    Hyperlipidemia Brother    Heart disease Brother     Colon cancer Neg Hx    Colon polyps Neg Hx    Rectal cancer Neg Hx    Stomach cancer Neg Hx     SOCIAL HISTORY: Social History   Socioeconomic History   Marital status: Married    Spouse name: Not on file   Number of children: Not on file   Years of education: Not on file   Highest education level: Not on file  Occupational History   Not on file  Tobacco Use   Smoking status: Never   Smokeless tobacco: Never  Vaping Use   Vaping status: Never Used  Substance and Sexual Activity   Alcohol use: Yes    Alcohol/week: 3.0 standard drinks of alcohol    Types: 2 Glasses of wine, 1 Cans of beer per week    Comment: social    Drug use: No   Sexual activity: Not on file  Other Topics Concern   Not on file  Social History Narrative   Married    Works at [employer] with wife    Social Determinants of Corporate investment banker Strain: Not on file  Food Insecurity: Not on file  Transportation Needs: Not on file  Physical Activity: Not on file  Stress: Not on file  Social Connections: Not on file  Intimate Partner Violence: Not on file     PHYSICAL EXAM  GENERAL EXAM/CONSTITUTIONAL: Vitals:  Vitals:   04/25/23 0839  BP: 124/65  Pulse: 77  Weight: 194 lb (88 kg)  Height: 5\' 10"  (1.778 m)   Body mass index is 27.84 kg/m. Wt Readings from Last 3 Encounters:  04/25/23 194 lb (88 kg)  04/18/23 193 lb 6.4 oz (87.7 kg)  01/16/23 189 lb (85.7 kg)   Patient is in no distress; well developed, nourished and groomed; neck is supple  CARDIOVASCULAR: Examination of carotid arteries is normal; no carotid bruits Regular rate and rhythm, no murmurs Examination of peripheral vascular system by observation and palpation is normal  EYES: Ophthalmoscopic exam of optic discs and posterior segments is normal; no papilledema or hemorrhages No results found.  MUSCULOSKELETAL: Gait, strength, tone, movements noted in Neurologic exam below  NEUROLOGIC: MENTAL STATUS:       No data to display         awake, alert, oriented to person, place and time recent and remote memory intact normal attention and concentration language fluent, comprehension intact, naming intact fund of knowledge appropriate  CRANIAL NERVE:  2nd - no papilledema on fundoscopic exam 2nd, 3rd, 4th, 6th - pupils equal and reactive to light, visual fields  full to confrontation, extraocular muscles intact, no nystagmus 5th - facial sensation symmetric 7th - facial strength symmetric 8th - hearing intact 9th - palate elevates symmetrically, uvula midline 11th - shoulder shrug symmetric 12th - tongue protrusion midline  MOTOR:  normal bulk and tone, full strength in the BUE, BLE  SENSORY:  normal and symmetric to light touch, temperature, vibration  COORDINATION:  finger-nose-finger, fine finger movements normal  REFLEXES:  deep tendon reflexes 1+ and symmetric  GAIT/STATION:  narrow based gait    DIAGNOSTIC DATA (LABS, IMAGING, TESTING) - I reviewed patient records, labs, notes, testing and imaging myself where available.  Lab Results  Component Value Date   WBC 8.7 11/30/2021   HGB 14.8 11/30/2021   HCT 43.4 11/30/2021   MCV 90.5 11/30/2021   PLT 293.0 11/30/2021      Component Value Date/Time   NA 134 (L) 11/30/2021 1511   K 4.2 11/30/2021 1511   CL 97 11/30/2021 1511   CO2 27 11/30/2021 1511   GLUCOSE 93 11/30/2021 1511   BUN 19 11/30/2021 1511   CREATININE 1.00 11/30/2021 1511   CALCIUM 10.1 11/30/2021 1511   PROT 7.4 11/30/2021 1511   ALBUMIN 5.0 11/30/2021 1511   AST 25 11/30/2021 1511   ALT 23 11/30/2021 1511   ALKPHOS 44 11/30/2021 1511   BILITOT 0.9 11/30/2021 1511   GFRNONAA >60 09/02/2020 1502   Lab Results  Component Value Date   CHOL 211 (H) 11/30/2021   HDL 76.00 11/30/2021   LDLCALC 116 (H) 11/30/2021   TRIG 95.0 11/30/2021   CHOLHDL 3 11/30/2021   Lab Results  Component Value Date   HGBA1C 5.9 11/30/2021   No results found for:  "VITAMINB12" Lab Results  Component Value Date   TSH 1.61 11/30/2021       ASSESSMENT AND PLAN  67 y.o. year old male here with history of headaches with migraine features for past 20 to 30 years, worsening in the past 1 year.   Meds tried: sumatriptan, rizatriptan, tylenol  Dx:  1. Worsening headaches      PLAN:  WORSENING HEADACHES / CHANGE IN FINE MOTOR SKILLS - check MRI brain (rule out stroke, vascular, mass, inflammatory etiologies)  MIGRAINE WITH AURA  MIGRAINE TREATMENT PLAN:  MIGRAINE PREVENTION  LIFESTYLE CHANGES -Stop or avoid smoking -Decrease or avoid caffeine / alcohol -Eat and sleep on a regular schedule -Exercise several times per week  May consider 1st line - start topiramate 50mg  at bedtime; after 1-2 weeks increase to 50mg  twice a day; drink plenty of water - start propranolol 40mg  daily; after 1-2 weeks increase to 40mg  twice a day - start amitriptyline 25mg  at bedtime  Then may consider 2nd line - rimegepant (Nurtec) 75mg  every other day - atogepant Bennie Pierini) 60mg  daily - erenumab (Aimovig) 70mg  monthly (may increase to 140mg  monthly) - fremanezumab (Ajovy) 225mg  monthly (or 675mg  every 3 months) - galazanezumab (Emgality) 240mg  loading dose; then 120mg  monthly  MIGRAINE RESCUE  - ibuprofen, tylenol as needed - rizatriptan (Maxalt) 10mg  as needed for breakthrough headache; may repeat x 1 after 2 hours; max 2 tabs per day or 8 per month - rimegepant (Nurtec) 75mg  as needed for breakthrough headache; max 8 per month  Orders Placed This Encounter  Procedures   MR BRAIN W WO CONTRAST   Meds ordered this encounter  Medications   Rimegepant Sulfate (NURTEC) 75 MG TBDP    Sig: Take 1 tablet (75 mg total) by mouth daily as needed.  Dispense:  8 tablet    Refill:  6   Return in about 6 weeks (around 06/06/2023) for MyChart visit (15 min).    Suanne Marker, MD 04/25/2023, 9:51 AM Certified in Neurology, Neurophysiology and  Neuroimaging  [facility] [address], [address] [place], [place] [address] [phone]

## 2023-04-25 NOTE — Patient Instructions (Signed)
MIGRAINE RESCUE  - ibuprofen, tylenol as needed - rizatriptan (Maxalt) 10mg  as needed for breakthrough headache; may repeat x 1 after 2 hours; max 2 tabs per day or 8 per month - rimegepant (Nurtec) 75mg  as needed for breakthrough headache; max 8 per month

## 2023-04-26 ENCOUNTER — Telehealth: Payer: Self-pay | Admitting: Diagnostic Neuroimaging

## 2023-04-26 NOTE — Telephone Encounter (Signed)
Pt reports he has been informed that for his Rimegepant Sulfate (NURTEC) 75 MG TBDP a prior authorization is needed

## 2023-04-27 ENCOUNTER — Other Ambulatory Visit (HOSPITAL_COMMUNITY): Payer: Self-pay

## 2023-04-27 ENCOUNTER — Telehealth: Payer: Self-pay

## 2023-04-27 NOTE — Telephone Encounter (Signed)
Pharmacy Patient Advocate Encounter   Received notification from Physician's Office that prior authorization for Nurtec 75MG  dispersible tablets is required/requested.   Insurance verification completed.   The patient is insured through [facility] .   Per test claim: PA required; PA submitted to above mentioned insurance via CoverMyMeds Key/confirmation #/EOC [id number] Status is pending

## 2023-05-01 ENCOUNTER — Other Ambulatory Visit (HOSPITAL_COMMUNITY): Payer: Self-pay

## 2023-05-01 NOTE — Telephone Encounter (Signed)
Pharmacy Patient Advocate Encounter  Received notification from [facility] that Prior Authorization for Nurtec 75MG  dispersible tablets has been APPROVED from 04/27/2023 to 04/25/2024. Ran test claim, Copay is $0. This test claim was processed through [facility]- copay amounts may vary at other pharmacies due to pharmacy/plan contracts, or as the patient moves through the different stages of their insurance plan.   PA #/Case ID/Reference #: PA Case ID #: [id number]

## 2023-05-02 ENCOUNTER — Ambulatory Visit: Payer: BC Managed Care – PPO

## 2023-05-02 DIAGNOSIS — R519 Headache, unspecified: Secondary | ICD-10-CM

## 2023-05-02 MED ORDER — GADOBENATE DIMEGLUMINE 529 MG/ML IV SOLN
18.0000 mL | Freq: Once | INTRAVENOUS | Status: AC | PRN
  Administered 2023-05-02: 18 mL via INTRAVENOUS

## 2023-05-10 ENCOUNTER — Encounter: Payer: Self-pay | Admitting: Diagnostic Neuroimaging

## 2023-05-26 ENCOUNTER — Telehealth: Payer: Self-pay | Admitting: Diagnostic Neuroimaging

## 2023-05-26 NOTE — Telephone Encounter (Signed)
 Pt called stating that he is getting more migraines and is wanting to go ahead and start taking he daily migraine medication that had been mentioned to him. Please advise.

## 2023-05-29 ENCOUNTER — Other Ambulatory Visit: Payer: Self-pay | Admitting: Diagnostic Neuroimaging

## 2023-05-29 MED ORDER — TOPIRAMATE 50 MG PO TABS: ORAL_TABLET | ORAL | 0 refills | Status: DC

## 2023-05-29 NOTE — Telephone Encounter (Signed)
 Pt had also called in and left a message with phone staff "Pt called stating that he is getting more migraines and is wanting to go ahead and start taking he daily migraine medication that had been mentioned to him. Please advise."

## 2023-05-29 NOTE — Telephone Encounter (Signed)
 Sent the patient a response via mychart since this was already being discussed. Please see previous mychart message.

## 2023-05-29 NOTE — Addendum Note (Signed)
 Addended by: Judi Cong on: 05/29/2023 10:25 AM   Modules accepted: Orders

## 2023-06-06 ENCOUNTER — Encounter: Payer: Self-pay | Admitting: Diagnostic Neuroimaging

## 2023-06-06 ENCOUNTER — Telehealth (INDEPENDENT_AMBULATORY_CARE_PROVIDER_SITE_OTHER): Payer: Medicare Other | Admitting: Diagnostic Neuroimaging

## 2023-06-06 DIAGNOSIS — G43109 Migraine with aura, not intractable, without status migrainosus: Secondary | ICD-10-CM

## 2023-06-06 NOTE — Progress Notes (Signed)
 [facility]  PATIENT: Willie Carpenter DOB: 1955-11-14  REFERRING CLINICIAN: Johnny Garnette LABOR, MD HISTORY FROM: patient  REASON FOR VISIT: follow up   HISTORICAL  CHIEF COMPLAINT:  Chief Complaint  Patient presents with   Migraine    HISTORY OF PRESENT ILLNESS:   UPDATE (06/06/23, VRP): Since last visit, doing about the same. Has continued with some intermittent headaches (at least 5 in the last 3 weeks). Triggers include low light, working on computer. Tolerating meds.  Now on topiramate , nurtec and rizatriptan .  PRIOR HPI (04/25/23): 68 year old male here for evaluation of headaches.  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  Developed some headaches around the 1990s.  Headache can consist of frontal, heavy, squeezing pain associate with photophobia, phonophobia.  No nausea or vomiting.  Sometimes sees some floaters.  Sometimes has visual scotoma and loses peripheral vision.  Headaches have been happening once or twice a year.  However in the last year have increasing frequency.  Has had 9 headaches in the past year including for the last month.  Also noticing some changes in fine motor skills.  Triggering factors may include red wine and stress.  Family history of migraine in father.  Has tried sumatriptan , rizatriptan  and Tylenol  with mild relief.   REVIEW OF SYSTEMS: Full 14 system review of systems performed and negative with exception of: as per HPI.  ALLERGIES: Allergies  Allergen Reactions   Erythromycin     Stomach pain    HOME MEDICATIONS: Outpatient Medications Prior to Visit  Medication Sig Dispense Refill   acetaminophen  (TYLENOL ) 500 MG tablet Take 1,000 mg by mouth every 8 (eight) hours as needed for moderate pain or headache.     ALPHA LIPOIC ACID PO Take by mouth.     atorvastatin  (LIPITOR) 20 MG tablet Take 1 tablet (20 mg total) by mouth daily. 90 tablet 3   B Complex Vitamins (B COMPLEX PO) Take by  mouth.     cetirizine (ZYRTEC) 10 MG tablet Take 10 mg by mouth daily as needed for allergies.     famotidine  (PEPCID ) 40 MG tablet Take 1 tablet (40 mg total) by mouth daily. 90 tablet 3   gabapentin  (NEURONTIN ) 100 MG capsule Take 1 capsule (100 mg total) by mouth as needed.     ketoconazole  (NIZORAL ) 2 % cream Apply 1 Application topically 2 (two) times daily as needed for irritation. 30 g 5   Omega-3 Fatty Acids (OMEGA 3 PO) Take by mouth.     Rimegepant Sulfate (NURTEC) 75 MG TBDP Take 1 tablet (75 mg total) by mouth daily as needed. 8 tablet 6   rizatriptan  (MAXALT ) 10 MG tablet Take 1 tablet (10 mg total) by mouth as needed for migraine. May repeat in 2 hours if needed 30 tablet 3   tamsulosin  (FLOMAX ) 0.4 MG CAPS capsule Take 2 capsules (0.8 mg total) by mouth daily. 180 capsule 3   topiramate  (TOPAMAX ) 50 MG tablet Take 1 tablet (50 mg total) by mouth at bedtime for 14 days, THEN 1 tablet (50 mg total) 2 (two) times daily for 14 days. 42 tablet 0   No facility-administered medications prior to visit.    PAST MEDICAL HISTORY: Past Medical History:  Diagnosis Date   Allergy    Crossed renal ectopia    left   GERD (gastroesophageal reflux disease)    diet related- PRN zantac OTC   Headache    Hyperlipidemia  Postherpetic neuralgia    Sciatica, left side     PAST SURGICAL HISTORY: Past Surgical History:  Procedure Laterality Date   COLONOSCOPY  08/03/2017   per Dr. Avram, sigmoid diverticulosis, no polyps, repeat in 10 yrs    EPIDURAL BLOCK INJECTION  Feb 2008 and May 2008   lumbar, in [place]    HERNIA REPAIR     inguinal hernia repair   KNEE SURGERY     key hole surgery 2016   LUMBAR DISC SURGERY  2007   in [place], at L5-S1    LUMBAR LAMINECTOMY/DECOMPRESSION MICRODISCECTOMY Left 09/04/2020   Procedure: Microlumbar decompression  L5-S1 left;  Surgeon: Duwayne Purchase, MD;  Location: [facility] OR;  Service: Orthopedics;  Laterality: Left;  2 hrs   RHINOPLASTY      SPINE SURGERY     TONSILLECTOMY AND ADENOIDECTOMY  1972    FAMILY HISTORY: Family History  Problem Relation Age of Onset   Arthritis Mother    Arthritis Father    Heart disease Father    Hypertension Father    Hyperlipidemia Father    Migraines Father    Hyperlipidemia Brother    Heart disease Brother    Colon cancer Neg Hx    Colon polyps Neg Hx    Rectal cancer Neg Hx    Stomach cancer Neg Hx     SOCIAL HISTORY: Social History   Socioeconomic History   Marital status: Married    Spouse name: Not on file   Number of children: Not on file   Years of education: Not on file   Highest education level: Not on file  Occupational History   Not on file  Tobacco Use   Smoking status: Never   Smokeless tobacco: Never  Vaping Use   Vaping status: Never Used  Substance and Sexual Activity   Alcohol use: Yes    Alcohol/week: 3.0 standard drinks of alcohol    Types: 2 Glasses of wine, 1 Cans of beer per week    Comment: social    Drug use: No   Sexual activity: Not on file  Other Topics Concern   Not on file  Social History Narrative   Married    Works at [employer] with wife    Social Drivers of Corporate Investment Banker Strain: Not on file  Food Insecurity: Not on file  Transportation Needs: Not on file  Physical Activity: Not on file  Stress: Not on file  Social Connections: Not on file  Intimate Partner Violence: Not on file     PHYSICAL EXAM  Video visit    DIAGNOSTIC DATA (LABS, IMAGING, TESTING) - I reviewed patient records, labs, notes, testing and imaging myself where available.  Lab Results  Component Value Date   WBC 8.7 11/30/2021   HGB 14.8 11/30/2021   HCT 43.4 11/30/2021   MCV 90.5 11/30/2021   PLT 293.0 11/30/2021      Component Value Date/Time   NA 134 (L) 11/30/2021 1511   K 4.2 11/30/2021 1511   CL 97 11/30/2021 1511   CO2 27 11/30/2021 1511   GLUCOSE 93 11/30/2021 1511   BUN 19 11/30/2021 1511   CREATININE 1.00  11/30/2021 1511   CALCIUM  10.1 11/30/2021 1511   PROT 7.4 11/30/2021 1511   ALBUMIN 5.0 11/30/2021 1511   AST 25 11/30/2021 1511   ALT 23 11/30/2021 1511   ALKPHOS 44 11/30/2021 1511   BILITOT 0.9 11/30/2021 1511   GFRNONAA >60  09/02/2020 1502   Lab Results  Component Value Date   CHOL 211 (H) 11/30/2021   HDL 76.00 11/30/2021   LDLCALC 116 (H) 11/30/2021   TRIG 95.0 11/30/2021   CHOLHDL 3 11/30/2021   Lab Results  Component Value Date   HGBA1C 5.9 11/30/2021   No results found for: VITAMINB12 Lab Results  Component Value Date   TSH 1.61 11/30/2021    05/02/23 MRI brain Unremarkable MRI brain (with and without). No acute findings.    ASSESSMENT AND PLAN  68 y.o. year old male here with history of headaches with migraine features for past 20 to 30 years, worsening in the past 1 year.   Meds tried: sumatriptan , rizatriptan , tylenol   Dx:  1. Migraine with aura and without status migrainosus, not intractable      PLAN:  MIGRAINE WITH AURA  MIGRAINE TREATMENT PLAN:  MIGRAINE PREVENTION  LIFESTYLE CHANGES -Stop or avoid smoking -Decrease or avoid caffeine / alcohol -Eat and sleep on a regular schedule -Exercise several times per week - continue topiramate  50mg  at bedtime; after 1-2 weeks increase to 50mg  twice a day; drink plenty of water  MIGRAINE RESCUE  - ibuprofen, tylenol  as needed - rizatriptan  (Maxalt ) 10mg  as needed for breakthrough headache; may repeat x 1 after 2 hours; max 2 tabs per day or 8 per month - rimegepant (Nurtec) 75mg  as needed for breakthrough headache; max 8 per month  Return in about 3 months (around 09/04/2023) for MyChart visit (15 min).  Virtual Visit via Video Note  I connected with Willie Carpenter on 06/06/23 at  2:45 PM EST by a video enabled telemedicine application and verified that I am speaking with the correct person using two identifiers.   I discussed the limitations of evaluation and management by telemedicine  and the availability of in person appointments. The patient expressed understanding and agreed to proceed.  Patient is at home and I am at the office.   I spent 25 minutes of face-to-face and non-face-to-face time with patient.  This included previsit chart review, lab review, study review, order entry, electronic health record documentation, patient education.      EDUARD FABIENE HANLON, MD 06/06/2023, 3:03 PM Certified in Neurology, Neurophysiology and Neuroimaging  [facility] [address], [address] [place], [place] [address] [phone]

## 2023-06-22 ENCOUNTER — Other Ambulatory Visit: Payer: Self-pay | Admitting: Diagnostic Neuroimaging

## 2023-06-23 ENCOUNTER — Other Ambulatory Visit: Payer: Self-pay | Admitting: Diagnostic Neuroimaging

## 2023-06-23 MED ORDER — TOPIRAMATE 50 MG PO TABS: 50.0000 mg | ORAL_TABLET | Freq: Two times a day (BID) | ORAL | 2 refills | Status: DC

## 2023-06-23 NOTE — Telephone Encounter (Signed)
Last seen on 06/06/23 " continue topiramate 50mg  at bedtime; after 1-2 weeks increase to 50mg  twice a day; drink plenty of water   No Return  about 3 months (around 09/04/2023) for MyChart visit (15 min). follow up scheduled

## 2023-06-27 ENCOUNTER — Other Ambulatory Visit: Payer: Self-pay | Admitting: Neurology

## 2023-06-27 DIAGNOSIS — R519 Headache, unspecified: Secondary | ICD-10-CM

## 2023-06-27 DIAGNOSIS — G43109 Migraine with aura, not intractable, without status migrainosus: Secondary | ICD-10-CM

## 2023-06-27 DIAGNOSIS — R0683 Snoring: Secondary | ICD-10-CM

## 2023-07-18 ENCOUNTER — Telehealth: Payer: Self-pay | Admitting: Diagnostic Neuroimaging

## 2023-07-18 ENCOUNTER — Ambulatory Visit: Payer: 59 | Admitting: Neurology

## 2023-07-18 ENCOUNTER — Encounter: Payer: Self-pay | Admitting: Neurology

## 2023-07-18 VITALS — BP 107/64 | HR 83 | Ht 70.0 in | Wt 178.0 lb

## 2023-07-18 DIAGNOSIS — Z9189 Other specified personal risk factors, not elsewhere classified: Secondary | ICD-10-CM | POA: Diagnosis not present

## 2023-07-18 DIAGNOSIS — R0681 Apnea, not elsewhere classified: Secondary | ICD-10-CM

## 2023-07-18 DIAGNOSIS — R0683 Snoring: Secondary | ICD-10-CM | POA: Diagnosis not present

## 2023-07-18 DIAGNOSIS — G4719 Other hypersomnia: Secondary | ICD-10-CM

## 2023-07-18 DIAGNOSIS — R351 Nocturia: Secondary | ICD-10-CM

## 2023-07-18 DIAGNOSIS — E663 Overweight: Secondary | ICD-10-CM

## 2023-07-18 DIAGNOSIS — R519 Headache, unspecified: Secondary | ICD-10-CM

## 2023-07-18 NOTE — Patient Instructions (Signed)

## 2023-07-18 NOTE — Progress Notes (Signed)
 Subjective:    Patient ID: Willie Carpenter is a 68 y.o. male.  HPI    Willie Foley, MD, PhD [facility] [address], [address] [address] [place], [place] [address]  Dear Satira Sark,  I saw your patient, Willie Carpenter, upon your kind request in my sleep clinic today for initial consultation of his sleep disorder, in particular, concern for underlying obstructive sleep apnea.  The patient is unaccompanied today.  As you know, Willie Carpenter is a 68 year old male with an underlying medical history of migraine headaches, allergies, crossed renal ectopia, reflux disease, hyperlipidemia, postherpetic neuralgia, sciatica and mildly overweight state, who reports snoring and excessive daytime somnolence as well as nighttime sleep disruption and witnessed apneas per wife's report.  His Epworth sleepiness score is 15 out of 24, fatigue severity score is 49 out of 63. I reviewed your office note from 07/29/2022 and your video visit note from 06/06/2023.  He lives with his wife and her daughter is staying with them currently.  She is preparing to take the LSAT.  He has a 110 year old son as well.  He works as a Airline pilot in Teacher, English as a foreign language.  He is a non-smoker and drinks alcohol infrequently.  He drinks caffeine in the form of tea in the morning and 2 shots of espresso in the mornings.  Bedtime is around 11 PM and rise time is around 7:30 AM.  He has occasional headaches and has nocturia about 3 times per average night.  They do not have a TV in the bedroom.  Topamax has helped his headaches.  He takes melatonin 5 mg at night.  He had a tonsillectomy as a child.  He is physically very active.  His Past Medical History Is Significant For: Past Medical History:  Diagnosis Date   Allergy    Crossed renal ectopia    left   GERD (gastroesophageal reflux disease)    diet related- PRN zantac OTC   Headache    Hyperlipidemia    Postherpetic neuralgia    Sciatica, left side     His  Past Surgical History Is Significant For: Past Surgical History:  Procedure Laterality Date   COLONOSCOPY  08/03/2017   per Dr. Leone Payor, sigmoid diverticulosis, no polyps, repeat in 10 yrs    EPIDURAL BLOCK INJECTION  Feb 2008 and May 2008   lumbar, in [place]    HERNIA REPAIR     inguinal hernia repair   KNEE SURGERY     key hole surgery 2016   LUMBAR DISC SURGERY  2007   in [place], at L5-S1    LUMBAR LAMINECTOMY/DECOMPRESSION MICRODISCECTOMY Left 09/04/2020   Procedure: Microlumbar decompression  L5-S1 left;  Surgeon: Jene Every, MD;  Location: [facility] OR;  Service: Orthopedics;  Laterality: Left;  2 hrs   RHINOPLASTY     SPINE SURGERY     TONSILLECTOMY AND ADENOIDECTOMY  1972    His Family History Is Significant For: Family History  Problem Relation Age of Onset   Arthritis Mother    Arthritis Father    Heart disease Father    Hypertension Father    Hyperlipidemia Father    Migraines Father    Hyperlipidemia Brother    Heart disease Brother    Colon cancer Neg Hx    Colon polyps Neg Hx    Rectal cancer Neg Hx    Stomach cancer Neg Hx    Sleep apnea Neg Hx     His Social History Is  Significant For: Social History   Socioeconomic History   Marital status: Married    Spouse name: Not on file   Number of children: Not on file   Years of education: Not on file   Highest education level: Not on file  Occupational History   Not on file  Tobacco Use   Smoking status: Never   Smokeless tobacco: Never  Vaping Use   Vaping status: Never Used  Substance and Sexual Activity   Alcohol use: Not Currently    Comment: social    Drug use: No   Sexual activity: Not on file  Other Topics Concern   Not on file  Social History Narrative   Married    Works at [employer] with wife    Social Drivers of Corporate investment banker Strain: Not on file  Food Insecurity: Not on file  Transportation Needs: Not on file  Physical Activity: Not on file  Stress: Not  on file  Social Connections: Not on file    His Allergies Are:  Allergies  Allergen Reactions   Erythromycin     Stomach pain  :   His Current Medications Are:  Outpatient Encounter Medications as of 07/18/2023  Medication Sig   acetaminophen (TYLENOL) 500 MG tablet Take 1,000 mg by mouth as needed for moderate pain (pain score 4-6) or headache.   atorvastatin (LIPITOR) 20 MG tablet Take 1 tablet (20 mg total) by mouth daily.   cetirizine (ZYRTEC) 10 MG tablet Take 10 mg by mouth daily as needed for allergies.   famotidine (PEPCID) 40 MG tablet Take 1 tablet (40 mg total) by mouth daily.   gabapentin (NEURONTIN) 100 MG capsule Take 1 capsule (100 mg total) by mouth as needed.   Omega-3 Fatty Acids (OMEGA 3 PO) Take by mouth as needed.   Rimegepant Sulfate (NURTEC) 75 MG TBDP Take 1 tablet (75 mg total) by mouth daily as needed.   rizatriptan (MAXALT) 10 MG tablet Take 1 tablet (10 mg total) by mouth as needed for migraine. May repeat in 2 hours if needed   tamsulosin (FLOMAX) 0.4 MG CAPS capsule Take 2 capsules (0.8 mg total) by mouth daily.   topiramate (TOPAMAX) 50 MG tablet Take 1 tablet (50 mg total) by mouth 2 (two) times daily.   ALPHA LIPOIC ACID PO Take by mouth. (Patient not taking: Reported on 07/18/2023)   B Complex Vitamins (B COMPLEX PO) Take by mouth. (Patient not taking: Reported on 07/18/2023)   ketoconazole (NIZORAL) 2 % cream Apply 1 Application topically 2 (two) times daily as needed for irritation. (Patient not taking: Reported on 07/18/2023)   No facility-administered encounter medications on file as of 07/18/2023.  :   Review of Systems:  Out of a complete 14 point review of systems, all are reviewed and negative with the exception of these symptoms as listed below:  Review of Systems  Neurological:        Pt here for sleep consult Pt snores,headache,fatigue, Pt denies sleep study,cpap,hypertension     Objective:  Neurological Exam  Physical Exam Physical  Examination:   Vitals:   07/18/23 0905  BP: 107/64  Pulse: 83    General Examination: The patient is a very pleasant 68 y.o. male in no acute distress. He appears well-developed and well-nourished and well groomed.   HEENT: Normocephalic, atraumatic, pupils are equal, round and reactive to light, extraocular tracking is good without limitation to gaze excursion or nystagmus noted. Hearing is  grossly intact. Face is symmetric with normal facial animation. Speech is clear with no dysarthria noted. There is no hypophonia. There is no lip, neck/head, jaw or voice tremor. Neck is supple with full range of passive and active motion. There are no carotid bruits on auscultation. Oropharynx exam reveals: mild mouth dryness, adequate dental hygiene and mild airway crowding, due to small airway entry, Mallampati class II, tonsils absent.  Minimal overbite noted.  Tongue protrudes centrally and palate elevates symmetrically.  Neck circumference 16 three-quarter inches.  Chest: Clear to auscultation without wheezing, rhonchi or crackles noted.  Heart: S1+S2+0, regular and normal without murmurs, rubs or gallops noted.   Abdomen: Soft, non-tender and non-distended.  Extremities: There is no pitting edema in the distal lower extremities bilaterally.   Skin: Warm and dry without trophic changes noted.   Musculoskeletal: exam reveals no obvious joint deformities.   Neurologically:  Mental status: The patient is awake, alert and oriented in all 4 spheres. His immediate and remote memory, attention, language skills and fund of knowledge are appropriate. There is no evidence of aphasia, agnosia, apraxia or anomia. Speech is clear with normal prosody and enunciation. Thought process is linear. Mood is normal and affect is normal.  Cranial nerves II - XII are as described above under HEENT exam.  Motor exam: Normal bulk, strength and tone is noted. There is no obvious action or resting tremor.  Fine motor  skills and coordination: grossly intact.  Cerebellar testing: No dysmetria or intention tremor. There is no truncal or gait ataxia.  Sensory exam: intact to light touch in the upper and lower extremities.  Gait, station and balance: He stands easily. No veering to one side is noted. No leaning to one side is noted. Posture is age-appropriate and stance is narrow based. Gait shows normal stride length and normal pace. No problems turning are noted.   Assessment and Plan:  In summary, ALPHONS BURGERT is a very pleasant 68 y.o.-year old male with an underlying medical history of migraine headaches, allergies, crossed renal ectopia, reflux disease, hyperlipidemia, postherpetic neuralgia, sciatica and mildly overweight state, whose history and physical exam are concerning for sleep disordered breathing, particularly  obstructive sleep apnea (OSA). A laboratory attended sleep study is typically considered "gold standard" for evaluation of sleep disordered breathing.   I had a long chat with the patient about my findings and the diagnosis of sleep apnea, particularly OSA, its prognosis and treatment options. We talked about medical/conservative treatments, surgical interventions and non-pharmacological approaches for symptom control. I explained, in particular, the risks and ramifications of untreated moderate to severe OSA, especially with respect to developing cardiovascular disease down the road, including congestive heart failure (CHF), difficult to treat hypertension, cardiac arrhythmias (particularly A-fib), neurovascular complications including TIA, stroke and dementia. Even type 2 diabetes has, in part, been linked to untreated OSA. Symptoms of untreated OSA may include (but may not be limited to) daytime sleepiness, nocturia (i.e. frequent nighttime urination), memory problems, mood irritability and suboptimally controlled or worsening mood disorder such as depression and/or anxiety, lack of energy, lack  of motivation, physical discomfort, as well as recurrent headaches, especially morning or nocturnal headaches. We talked about the importance of maintaining a healthy lifestyle and striving for healthy weight. In addition, we talked about the importance of striving for and maintaining good sleep hygiene. I recommended a sleep study at this time. I outlined the differences between a laboratory attended sleep study which is considered more comprehensive and accurate over  the option of a home sleep test (HST); the latter may lead to underestimation of sleep disordered breathing in some instances and does not help with diagnosing upper airway resistance syndrome and is not accurate enough to diagnose primary central sleep apnea typically. I outlined possible surgical and non-surgical treatment options of OSA, including the use of a positive airway pressure (PAP) device (i.e. CPAP, AutoPAP/APAP or BiPAP in certain circumstances), a custom-made dental device (aka oral appliance, which would require a referral to a specialist dentist or orthodontist typically, and is generally speaking not considered for patients with full dentures or edentulous state), upper airway surgical options, such as traditional UPPP (which is not considered a first-line treatment) or the Inspire device (hypoglossal nerve stimulator, which would involve a referral for consultation with an ENT surgeon, after careful selection, following inclusion criteria - also not first-line treatment). I explained the PAP treatment option to the patient in detail, as this is generally considered first-line treatment.  The patient indicated that he would be willing to try PAP therapy, if the need arises. I explained the importance of being compliant with PAP treatment, not only for insurance purposes but primarily to improve patient's symptoms symptoms, and for the patient's long term health benefit, including to reduce His cardiovascular risks longer-term.     We will pick up our discussion about the next steps and treatment options after testing.  We will keep him posted as to the test results by phone call and/or MyChart messaging where possible.  We will plan to follow-up in sleep clinic accordingly as well.  I answered all his questions today and the patient was in agreement.   I encouraged him to call with any interim questions, concerns, problems or updates or email [place] through MyChart.  Generally speaking, sleep test authorizations may take up to 2 weeks, sometimes less, sometimes longer, the patient is encouraged to get in touch with [place] if they do not hear back from the sleep lab staff directly within the next 2 weeks.  Thank you very much for allowing me to participate in the care of this nice patient. If I can be of any further assistance to you please do not hesitate to talk to me.  Sincerely,   Willie Foley, MD, PhD

## 2023-07-18 NOTE — Telephone Encounter (Signed)
 Pt called at 8:30am to make office aware running a little behind but will be in 8 minutes.

## 2023-07-19 ENCOUNTER — Telehealth: Payer: Self-pay | Admitting: Neurology

## 2023-07-19 NOTE — Telephone Encounter (Signed)
 [employer] state no auth req   Patient is scheduled at [facility] for 08/15/23 at 9 pm.  Mailed packet to the patient.

## 2023-08-15 ENCOUNTER — Ambulatory Visit (INDEPENDENT_AMBULATORY_CARE_PROVIDER_SITE_OTHER): Payer: 59 | Admitting: Neurology

## 2023-08-15 DIAGNOSIS — R351 Nocturia: Secondary | ICD-10-CM

## 2023-08-15 DIAGNOSIS — Z9189 Other specified personal risk factors, not elsewhere classified: Secondary | ICD-10-CM

## 2023-08-15 DIAGNOSIS — G4733 Obstructive sleep apnea (adult) (pediatric): Secondary | ICD-10-CM

## 2023-08-15 DIAGNOSIS — E663 Overweight: Secondary | ICD-10-CM

## 2023-08-15 DIAGNOSIS — G4720 Circadian rhythm sleep disorder, unspecified type: Secondary | ICD-10-CM

## 2023-08-15 DIAGNOSIS — R0681 Apnea, not elsewhere classified: Secondary | ICD-10-CM

## 2023-08-15 DIAGNOSIS — G4719 Other hypersomnia: Secondary | ICD-10-CM

## 2023-08-15 DIAGNOSIS — R0683 Snoring: Secondary | ICD-10-CM

## 2023-08-15 DIAGNOSIS — R519 Headache, unspecified: Secondary | ICD-10-CM

## 2023-08-25 NOTE — Procedures (Signed)
 Physician Interpretation:     [facility] POLYSOMNOGRAPHY  INTERPRETATION REPORT   STUDY DATE:  08/15/2023     PATIENT NAME:  Willie Carpenter         DATE OF BIRTH:  June 03, 1955  PATIENT ID:  [id number]    TYPE OF STUDY:  PSG  READING PHYSICIAN: Huston Foley, MD, PhD   SCORING TECHNICIAN: Margaretann Loveless, RPSGT     Referred by: Joycelyn Schmid, MD ? History and Indication for Testing: 68 year old male with an underlying medical history of migraine headaches, allergies, crossed renal ectopia, reflux disease, hyperlipidemia, postherpetic neuralgia, sciatica and mildly overweight state, who reports snoring and excessive daytime somnolence as well as nighttime sleep disruption and witnessed apneas per wife's report. His Epworth sleepiness score is 15 out of 24, fatigue severity score is 49 out of 63. Height: 61 in Weight: 178 lb (BMI 33) Neck Size: 17 in    MEDICATIONS: Tylenol, Lipitor, Zyrtec, Pepcid, Neurontin, Omega 3, Nurtec, Maxalt, Topamax, Flomax   TECHNICAL DESCRIPTION: A registered sleep technologist was in attendance for the duration of the recording.  Data collection, scoring, video monitoring, and reporting were performed in compliance with the AASM Manual for the Scoring of Sleep and Associated Events; (Hypopnea is scored based on the criteria listed in Section VIII D. 1b in the AASM Manual V2.6 using a 4% oxygen desaturation rule or Hypopnea is scored based on the criteria listed in Section VIII D. 1a in the AASM Manual V2.6 using 3% oxygen desaturation and /or arousal rule).   SLEEP CONTINUITY AND SLEEP ARCHITECTURE:  Lights-out was at 22:38: and lights-on at  05:05:, with a total recording time of 6 hours, 27.5 min. Total sleep time ( TST) was 196.5 minutes with a decreased sleep efficiency at 50.7%. There was  9.2% REM sleep.  BODY POSITION:  TST was divided  between the following sleep positions: 7.9% supine;  92.1% lateral;  0% prone. Duration  of total sleep and percent of total sleep in their respective position is as follows: supine 15 minutes (8%), non-supine 181 minutes (92%); right 139 minutes (71%), left 42 minutes (21%), and prone 00 minutes (0%).  Total supine REM sleep time was 00 minutes (0% of total REM sleep).  Sleep latency was increased at 85.5 minutes.  REM sleep latency was markedly increased at 282.5 minutes. Of the total sleep time, the percentage of stage N1 sleep was 15.5%, which is increased, stage N2 sleep was 75%, which is increased, stage N3 sleep was absent, and REM sleep was 9.2%, which is reduced. Wake after sleep onset (WASO) time accounted for 105.5 minutes with moderate sleep fragmentation noted and several longer periods of wakefulness.   RESPIRATORY MONITORING:   Based on CMS criteria (using a 4% oxygen desaturation rule for scoring hypopneas), there were 22 apneas (22 obstructive; 0 central; 0 mixed), and 16 hypopneas.  Apnea index was 6.7. Hypopnea index was 4.9. The apnea-hypopnea index was 11.6/hour overall (0.0 supine, 0 non-supine; 0.0 REM, 0.0 supine REM).  There were 0 respiratory effort-related arousals (RERAs).  The RERA index was 0 events/h. Total respiratory disturbance index (RDI) was 11.6 events/h. RDI results showed: supine RDI  0.0 /h; non-supine RDI 12.6 /h; REM RDI 0.0 /h, supine REM RDI 0.0 /h.   Based on AASM criteria (using a 3% oxygen desaturation and /or arousal rule for scoring hypopneas), there were 22 apneas (22 obstructive; 0 central; 0 mixed), and 54 hypopneas. Apnea index was 6.7. Hypopnea index was  16.5. The apnea-hypopnea index was 23.2 overall (15.5 supine, 7 non-supine; 6.7 REM, 0.0 supine REM).  There were 0 respiratory effort-related arousals (RERAs).  The RERA index was 0 events/h. Total respiratory disturbance index (RDI) was 23.2 events/h. RDI results showed: supine RDI  15.5 /h; non-supine RDI 23.9 /h; REM RDI 6.7 /h, supine REM RDI 0.0 /h.    OXIMETRY: Oxyhemoglobin  Saturation Nadir during sleep was at  87% from a mean of 97%.  Of the Total sleep time (TST)   hypoxemia (=<88%) was present for  0.0 minutes, or 0.0% of total sleep time.  LIMB MOVEMENTS: There were 5 periodic limb movements of sleep (1.5/hr), of which 0 (0.0/hr) were associated with an arousal.   AROUSAL: There were 93 arousals in total, for an arousal index of 28 arousals/hour.  Of these, 49 were identified as respiratory-related arousals (15 /h), 0 were PLM-related arousals (0 /h), and 53 were non-specific arousals (16 /h).    EEG: Review of the EEG showed no abnormal electrical discharges and symmetrical bihemispheric findings.    EKG: The EKG revealed normal sinus rhythm (NSR). The average heart rate during sleep was 57 bpm.   AUDIO/VIDEO REVIEW: The audio and video review did not show any abnormal or unusual behaviors, movements, phonations or vocalizations. The patient took 3 restroom breaks. Snoring was noted, ranging from mild to loud.   POST-STUDY QUESTIONNAIRE: Post study, the patient indicated, that sleep was worse than usual.   IMPRESSION:  1. Obstructive Sleep Apnea (OSA) 2. Dysfunctions associated with sleep stages or arousal from sleep  RECOMMENDATIONS:  1. This study demonstrates overall mild obstructive sleep apnea, with a total AHI of 11.6/hour, and O2 nadir of 87%. Given the patient's medical history and sleep related complaints, treatment with positive airway pressure is recommended; this can be achieved in the form of autoPAP. A full-night CPAP titration study would allow optimization of therapy if needed, down the road. Other treatment options may include avoidance of supine sleep position along with weight loss (where clinically feasible and applicable), or the use of an oral appliance in selected patients.  Generally speaking, surgical treatment options are not recommended for mild obstructive sleep apnea. Please note, that untreated obstructive sleep apnea may carry  additional perioperative morbidity. Patients with significant obstructive sleep apnea should receive perioperative PAP therapy and the surgeons and particularly the anesthesiologist should be informed of the diagnosis and the severity of the sleep disordered breathing. 2. This study shows sleep fragmentation and abnormal sleep stage percentages; these are nonspecific findings and per se do not signify an intrinsic sleep disorder or a cause for the patient's sleep-related symptoms. Causes include (but are not limited to) the first night effect of the sleep study, circadian rhythm disturbances, medication effect or an underlying mood disorder or medical problem.  3. The patient should be cautioned not to drive, work at heights, or operate dangerous or heavy equipment when tired or sleepy. Review and reiteration of good sleep hygiene measures should be pursued with any patient. 4. The patient will be seen in follow-up in the Sleep Clinic at [facility] for discussion of the test results, progress with treatment, and recheck on symptoms as well as potential further management strategies. The referring provider and the patient will be notified of the test results.     I certify that I have reviewed the entire raw data recording prior to the issuance of this report in accordance with the Standards of Accreditation of the American Academy of Sleep  Medicine (AASM).  Huston Foley, MD, PhD Medical Director, [facility]) Diplomat, ABPN (Neurology and Sleep)               Technical Report:   General Information  Name: Willie Carpenter, Willie Carpenter BMI: 33.63 Physician: Huston Foley, MD  ID: [id number] Height: 61.0 in Technician: Margaretann Loveless, RPSGT  Sex: Male Weight: 178.0 lb Record: [id number]  Age: 68 [09/09/1955] Date: 08/15/2023    Medical & Medication History    68 year old male with an underlying medical history of migraine headaches, allergies, crossed renal ectopia,  reflux disease, hyperlipidemia, postherpetic neuralgia, sciatica and mildly overweight state, who reports snoring and excessive daytime somnolence as well as nighttime sleep disruption and witnessed apneas per wife's report. Tylenol, Lipitor, Zyrtec, Pepcid, Neurontin, Omega 3, Nurtec, Maxalt, Topamax, Flomax   Sleep Disorder      Comments   The patient came into the lab for a PSG. The patient took Melatonin. He wore a mouth guard during the night. Some leg movements noted. The patient had three restroom breaks. EKG showed no obvious cardiac arrhythmias. Moderate to loud snoring. Respiratory events scored with a 3% desat. Slept mostly supine. AHI was 22.3 after 2 hrs of TST. The patient had very fragmented sleep.     Lights out: 10:38:02 PM Lights on: 05:05:12 AM   Time Total Supine Side Prone Upright  Recording (TRT) 6h 27.69m 1h 51.79m 4h 36.34m 0h 0.47m 0h 0.62m  Sleep (TST) 3h 16.61m 0h 15.102m 3h 1.31m 0h 0.36m 0h 0.56m   Latency N1 N2 N3 REM Onset Per. Slp. Eff.  Actual 0h 0.53m 0h 2.64m 0h 0.73m 4h 42.42m 1h 25.26m 2h 26.70m 50.71%   Stg Dur Wake N1 N2 N3 REM  Total 191.0 30.5 148.0 0.0 18.0  Supine 96.0 5.5 10.0 0.0 0.0  Side 95.0 25.0 138.0 0.0 18.0  Prone 0.0 0.0 0.0 0.0 0.0  Upright 0.0 0.0 0.0 0.0 0.0   Stg % Wake N1 N2 N3 REM  Total 49.3 15.5 75.3 0.0 9.2  Supine 24.8 2.8 5.1 0.0 0.0  Side 24.5 12.7 70.2 0.0 9.2  Prone 0.0 0.0 0.0 0.0 0.0  Upright 0.0 0.0 0.0 0.0 0.0     Apnea Summary Sub Supine Side Prone Upright  Total 22 Total 22 0 22 0 0    REM 0 0 0 0 0    NREM 22 0 22 0 0  Obs 22 REM 0 0 0 0 0    NREM 22 0 22 0 0  Mix 0 REM 0 0 0 0 0    NREM 0 0 0 0 0  Cen 0 REM 0 0 0 0 0    NREM 0 0 0 0 0   Rera Summary Sub Supine Side Prone Upright  Total 0 Total 0 0 0 0 0    REM 0 0 0 0 0    NREM 0 0 0 0 0   Hypopnea Summary Sub Supine Side Prone Upright  Total 54 Total 54 4 50 0 0    REM 2 0 2 0 0    NREM 52 4 48 0 0   4% Hypopnea Summary Sub Supine Side Prone Upright  Total  (4%) 16 Total 16 0 16 0 0    REM 0 0 0 0 0    NREM 16 0 16 0 0     AHI Total Obs Mix Cen  23.21 Apnea 6.72 6.72 0.00 0.00   Hypopnea 16.49 -- -- --  11.60 Hypopnea (4%) 4.89 -- -- --    Total Supine Side Prone Upright  Position AHI 23.21 15.48 23.87 0.00 0.00  REM AHI 6.67   NREM AHI 24.87   Position RDI 23.21 15.48 23.87 0.00 0.00  REM RDI 6.67   NREM RDI 24.87    4% Hypopnea Total Supine Side Prone Upright  Position AHI (4%) 11.60 0.00 12.60 0.00 0.00  REM AHI (4%) 0.00   NREM AHI (4%) 12.77   Position RDI (4%) 11.60 0.00 12.60 0.00 0.00  REM RDI (4%) 0.00   NREM RDI (4%) 12.77    Desaturation Information Threshold: 2% <100% <90% <80% <70% <60% <50% <40%  Supine 54.0 2.0 1.0 0.0 0.0 0.0 0.0  Side 142.0 3.0 0.0 0.0 0.0 0.0 0.0  Prone 0.0 0.0 0.0 0.0 0.0 0.0 0.0  Upright 0.0 0.0 0.0 0.0 0.0 0.0 0.0  Total 196.0 5.0 1.0 0.0 0.0 0.0 0.0  Index 39.0 1.0 0.2 0.0 0.0 0.0 0.0   Threshold: 3% <100% <90% <80% <70% <60% <50% <40%  Supine 39.0 2.0 1.0 0.0 0.0 0.0 0.0  Side 71.0 3.0 0.0 0.0 0.0 0.0 0.0  Prone 0.0 0.0 0.0 0.0 0.0 0.0 0.0  Upright 0.0 0.0 0.0 0.0 0.0 0.0 0.0  Total 110.0 5.0 1.0 0.0 0.0 0.0 0.0  Index 21.9 1.0 0.2 0.0 0.0 0.0 0.0   Threshold: 4% <100% <90% <80% <70% <60% <50% <40%  Supine 24.0 2.0 1.0 0.0 0.0 0.0 0.0  Side 29.0 3.0 0.0 0.0 0.0 0.0 0.0  Prone 0.0 0.0 0.0 0.0 0.0 0.0 0.0  Upright 0.0 0.0 0.0 0.0 0.0 0.0 0.0  Total 53.0 5.0 1.0 0.0 0.0 0.0 0.0  Index 10.5 1.0 0.2 0.0 0.0 0.0 0.0   Threshold: 3% <100% <90% <80% <70% <60% <50% <40%  Supine 39 2 1 0 0 0 0  Side 71 3 0 0 0 0 0  Prone 0 0 0 0 0 0 0  Upright 0 0 0 0 0 0 0  Total 110 5 1 0 0 0 0   Awakening/Arousal Information # of Awakenings 23  Wake after sleep onset 105.55m  Wake after persistent sleep 76.67m   Arousal Assoc. Arousals Index  Apneas 11 3.4  Hypopneas 38 11.6  Leg Movements 2 0.6  Snore 0 0.0  PTT Arousals 0 0.0  Spontaneous 53 16.2  Total 104 31.8  Leg Movement  Information PLMS LMs Index  Total LMs during PLMS 5 1.5  LMs w/ Microarousals 0 0.0   LM LMs Index  w/ Microarousal 2 0.6  w/ Awakening 0 0.0  w/ Resp Event 0 0.0  Spontaneous 29 8.9  Total 31 9.5     Desaturation threshold setting: 3% Minimum desaturation setting: 10 seconds SaO2 nadir: 71% The longest event was a 74 sec obstructive Hypopnea with a minimum SaO2 of 93%. The lowest SaO2 was 89% associated with a 21 sec obstructive Apnea. EKG Rates EKG Avg Max Min  Awake 60 85 51  Asleep 57 71 50  EKG Events: N/A

## 2023-08-25 NOTE — Addendum Note (Signed)
 Addended by: Huston Foley on: 08/25/2023 02:06 PM   Modules accepted: Orders

## 2023-08-28 ENCOUNTER — Telehealth: Payer: Self-pay

## 2023-08-28 ENCOUNTER — Telehealth: Payer: Self-pay | Admitting: *Deleted

## 2023-08-28 NOTE — Telephone Encounter (Signed)
-----   Message from Huston Foley sent at 08/25/2023  2:06 PM EDT ----- Patient referred by Dr. Marjory Lies, seen by me on 07/18/23, diagnostic PSG on 08/15/23.    Please call and notify the patient that the recent sleep study did confirm the diagnosis of obstructive sleep apnea. OSA is overall mild, but worth treating to see if he feels better after treatment. To that end I recommend treatment for this in the form of autoPAP, which means, that we don't have to bring him back for a second sleep study with CPAP, but will let him try an autoPAP machine at home, through a DME company (of his choice, or as per insurance requirement). The DME representative will educate him on how to use the machine, how to put the mask on, etc. I have placed an order in the chart. Please send referral, talk to patient, send report to referring MD. We will need a FU in sleep clinic for 10 weeks post-PAP set up, please arrange that with me or one of our NPs. Thanks,   Huston Foley, MD, PhD [facility])

## 2023-08-28 NOTE — Telephone Encounter (Signed)
Patient calling for sleep study results. °

## 2023-08-28 NOTE — Telephone Encounter (Signed)
 Informed pt of their sleep study results. The study showed mild OSA overall but that Dr Frances Furbish recommends autopap treatment to see if patient feels better after treatment. The patient verbalized understanding and agreement to proceed. He does want his results mailed to him (address confirmed). His questions were answered.  He wanted to make sure insurance paid towards the machine and I told him the DME company would confirm this and discuss any pricing with him.  The patient did not have a preference for a specific DME.  He will watch for a call from [facility].  We discussed the insurance compliance requirements which include using the machine at least 4 hours at night and also being seen by our office for follow-up between 30 and 90 days after set up.  The patient scheduled an appointment for June 23 at 12:45 PM arrival 15 to 30 minutes early.  Referral sent to [facility]. Report forwarded to Dr Marjory Lies.

## 2023-08-28 NOTE — Telephone Encounter (Signed)
 Huston Foley, MD to [facility] Results    08/25/23  2:06 PM Result Note Patient referred by Dr. Marjory Lies, seen by me on 07/18/23, diagnostic PSG on 08/15/23.   Please call and notify the patient that the recent sleep study did confirm the diagnosis of obstructive sleep apnea. OSA is overall mild, but worth treating to see if he feels better after treatment. To that end I recommend treatment for this in the form of autoPAP, which means, that we don't have to bring him back for a second sleep study with CPAP, but will let him try an autoPAP machine at home, through a DME company (of his choice, or as per insurance requirement). The DME representative will educate him on how to use the machine, how to put the mask on, etc. I have placed an order in the chart. Please send referral, talk to patient, send report to referring MD. We will need a FU in sleep clinic for 10 weeks post-PAP set up, please arrange that with me or one of our NPs. Thanks, Huston Foley, MD, PhD [facility])

## 2023-08-28 NOTE — Telephone Encounter (Signed)
 I charted this in a result phone note.

## 2023-08-29 NOTE — Telephone Encounter (Signed)
 Zott, Linnell Fulling, Otilio Jefferson, RN; [place] Got it Thank  You

## 2023-09-26 ENCOUNTER — Telehealth: Payer: Self-pay | Admitting: Neurology

## 2023-09-26 NOTE — Telephone Encounter (Signed)
 Routed to POD 4 in error

## 2023-09-26 NOTE — Telephone Encounter (Signed)
 ..  Pt understands that although there may be some limitations with this type of visit, we will take all precautions to reduce any security or privacy concerns.  Pt understands that this will be treated like an in office visit and we will file with pt's insurance, and there may be a patient responsible charge related to this service. ? ?

## 2023-09-26 NOTE — Telephone Encounter (Signed)
Routing to POD 3

## 2023-10-01 ENCOUNTER — Other Ambulatory Visit: Payer: Self-pay | Admitting: Diagnostic Neuroimaging

## 2023-10-03 ENCOUNTER — Other Ambulatory Visit: Payer: Self-pay | Admitting: Family Medicine

## 2023-10-03 DIAGNOSIS — N138 Other obstructive and reflux uropathy: Secondary | ICD-10-CM

## 2023-11-13 ENCOUNTER — Ambulatory Visit: Admitting: Adult Health

## 2023-11-13 ENCOUNTER — Encounter: Payer: Self-pay | Admitting: Adult Health

## 2023-11-13 VITALS — BP 124/85 | HR 65 | Ht 70.0 in | Wt 187.0 lb

## 2023-11-13 DIAGNOSIS — G4733 Obstructive sleep apnea (adult) (pediatric): Secondary | ICD-10-CM

## 2023-11-13 NOTE — Patient Instructions (Addendum)
 Your Plan:  Continue nightly use of CPAP with greater than 4 hours per night for optimal benefit and per insurance requirements  Continue to follow with your DME [facility] for CPAP supplies or CPAP related concerns. Please be sure to change your filter every month, your mask about every 3 months, hose about every 6 months, humidifier chamber about yearly. Some restrictions are imposed by your insurance carrier with regard to how frequently you can get certain supplies. Your DME company can provide further details if necessary.        Follow-up in 1 year for CPAP follow up or call earlier if needed      Thank you for coming to see us  at [facility]. I hope we have been able to provide you high quality care today.  You may receive a patient satisfaction survey over the next few weeks. We would appreciate your feedback and comments so that we may continue to improve ourselves and the health of our patients.

## 2023-11-20 NOTE — Progress Notes (Signed)
 Rhylen Pulido D, CMA  Zott, Gasper Ona, Tammy; Darrel Boyer New orders have been placed for the above pt, DOB: 2056/05/23 Thanks

## 2024-01-01 ENCOUNTER — Encounter: Payer: Self-pay | Admitting: Diagnostic Neuroimaging

## 2024-01-01 ENCOUNTER — Telehealth: Admitting: Diagnostic Neuroimaging

## 2024-01-01 DIAGNOSIS — G43109 Migraine with aura, not intractable, without status migrainosus: Secondary | ICD-10-CM

## 2024-01-01 DIAGNOSIS — G43909 Migraine, unspecified, not intractable, without status migrainosus: Secondary | ICD-10-CM

## 2024-01-01 MED ORDER — NURTEC 75 MG PO TBDP: 75.0000 mg | ORAL_TABLET | Freq: Every day | ORAL | 6 refills | Status: AC | PRN

## 2024-01-01 MED ORDER — RIZATRIPTAN BENZOATE 10 MG PO TABS: 10.0000 mg | ORAL_TABLET | ORAL | 3 refills | Status: AC | PRN

## 2024-01-01 MED ORDER — TOPIRAMATE 50 MG PO TABS: 50.0000 mg | ORAL_TABLET | Freq: Two times a day (BID) | ORAL | 2 refills | Status: AC

## 2024-01-01 NOTE — Progress Notes (Signed)
 [facility]  PATIENT: Willie Carpenter DOB: 1956/01/09  REFERRING CLINICIAN: Johnny Garnette LABOR, MD HISTORY FROM: patient  REASON FOR VISIT: follow up   HISTORICAL  CHIEF COMPLAINT:  Chief Complaint  Patient presents with   Migraine         HISTORY OF PRESENT ILLNESS:   UPDATE (01/01/24, VRP): Since last visit, doing better with headaches on topiramate . 1-2 migraine every month now. TPX 50mg  daily better than twice a day dosing (more side effects). Rizatriptan  and nurtec helping.   UPDATE (06/06/23, VRP): Since last visit, doing about the same. Has continued with some intermittent headaches (at least 5 in the last 3 weeks). Triggers include low light, working on computer. Tolerating meds.  Now on topiramate , nurtec and rizatriptan .  PRIOR HPI (04/25/23): 68 year old male here for evaluation of headaches.  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  Developed some headaches around the 1990s.  Headache can consist of frontal, heavy, squeezing pain associate with photophobia, phonophobia.  No nausea or vomiting.  Sometimes sees some floaters.  Sometimes has visual scotoma and loses peripheral vision.  Headaches have been happening once or twice a year.  However in the last year have increasing frequency.  Has had 9 headaches in the past year including for the last month.  Also noticing some changes in fine motor skills.  Triggering factors may include red wine and stress.  Family history of migraine in father.  Has tried sumatriptan , rizatriptan  and Tylenol  with mild relief.   REVIEW OF SYSTEMS: Full 14 system review of systems performed and negative with exception of: as per HPI.  ALLERGIES: Allergies  Allergen Reactions   Erythromycin     Stomach pain    HOME MEDICATIONS: Outpatient Medications Prior to Visit  Medication Sig Dispense Refill   acetaminophen  (TYLENOL ) 500 MG tablet Take 1,000 mg by mouth as needed for moderate pain  (pain score 4-6) or headache.     atorvastatin  (LIPITOR) 20 MG tablet Take 1 tablet (20 mg total) by mouth daily. 90 tablet 3   cetirizine (ZYRTEC) 10 MG tablet Take 10 mg by mouth daily as needed for allergies.     famotidine  (PEPCID ) 40 MG tablet Take 1 tablet (40 mg total) by mouth daily. 90 tablet 3   tamsulosin  (FLOMAX ) 0.4 MG CAPS capsule TAKE 2 CAPSULES(0.8 MG) BY MOUTH DAILY 180 capsule 3   ALPHA LIPOIC ACID PO Take by mouth.     Omega-3 Fatty Acids (OMEGA 3 PO) Take by mouth as needed.     Rimegepant Sulfate (NURTEC) 75 MG TBDP Take 1 tablet (75 mg total) by mouth daily as needed. 8 tablet 6   rizatriptan  (MAXALT ) 10 MG tablet Take 1 tablet (10 mg total) by mouth as needed for migraine. May repeat in 2 hours if needed 30 tablet 3   topiramate  (TOPAMAX ) 50 MG tablet TAKE 1 TABLET BY MOUTH TWICE DAILY 60 tablet 2   No facility-administered medications prior to visit.    PAST MEDICAL HISTORY: Past Medical History:  Diagnosis Date   Allergy    Crossed renal ectopia    left   GERD (gastroesophageal reflux disease)    diet related- PRN zantac OTC   Headache    Hyperlipidemia    Postherpetic neuralgia    Sciatica, left side     PAST SURGICAL HISTORY: Past Surgical History:  Procedure Laterality Date   COLONOSCOPY  08/03/2017   per Dr. Avram, sigmoid diverticulosis,  no polyps, repeat in 10 yrs    EPIDURAL BLOCK INJECTION  Feb 2008 and May 2008   lumbar, in [place]    HERNIA REPAIR     inguinal hernia repair   KNEE SURGERY     key hole surgery 2016   LUMBAR DISC SURGERY  2007   in [place], at L5-S1    LUMBAR LAMINECTOMY/DECOMPRESSION MICRODISCECTOMY Left 09/04/2020   Procedure: Microlumbar decompression  L5-S1 left;  Surgeon: Duwayne Purchase, MD;  Location: [facility] OR;  Service: Orthopedics;  Laterality: Left;  2 hrs   RHINOPLASTY     SPINE SURGERY     TONSILLECTOMY AND ADENOIDECTOMY  1972    FAMILY HISTORY: Family History  Problem Relation Age of Onset    Arthritis Mother    Arthritis Father    Heart disease Father    Hypertension Father    Hyperlipidemia Father    Migraines Father    Hyperlipidemia Brother    Heart disease Brother    Colon cancer Neg Hx    Colon polyps Neg Hx    Rectal cancer Neg Hx    Stomach cancer Neg Hx    Sleep apnea Neg Hx     SOCIAL HISTORY: Social History   Socioeconomic History   Marital status: Married    Spouse name: Not on file   Number of children: Not on file   Years of education: Not on file   Highest education level: Not on file  Occupational History   Not on file  Tobacco Use   Smoking status: Never   Smokeless tobacco: Never  Vaping Use   Vaping status: Never Used  Substance and Sexual Activity   Alcohol use: Not Currently    Comment: social    Drug use: No   Sexual activity: Not on file  Other Topics Concern   Not on file  Social History Narrative   Married    Works at [employer] with wife    Social Drivers of Corporate investment banker Strain: Not on file  Food Insecurity: Not on file  Transportation Needs: Not on file  Physical Activity: Not on file  Stress: Not on file  Social Connections: Not on file  Intimate Partner Violence: Not on file     PHYSICAL EXAM  Video visit    DIAGNOSTIC DATA (LABS, IMAGING, TESTING) - I reviewed patient records, labs, notes, testing and imaging myself where available.  Lab Results  Component Value Date   WBC 8.7 11/30/2021   HGB 14.8 11/30/2021   HCT 43.4 11/30/2021   MCV 90.5 11/30/2021   PLT 293.0 11/30/2021      Component Value Date/Time   NA 134 (L) 11/30/2021 1511   K 4.2 11/30/2021 1511   CL 97 11/30/2021 1511   CO2 27 11/30/2021 1511   GLUCOSE 93 11/30/2021 1511   BUN 19 11/30/2021 1511   CREATININE 1.00 11/30/2021 1511   CALCIUM  10.1 11/30/2021 1511   PROT 7.4 11/30/2021 1511   ALBUMIN 5.0 11/30/2021 1511   AST 25 11/30/2021 1511   ALT 23 11/30/2021 1511   ALKPHOS 44 11/30/2021 1511   BILITOT 0.9  11/30/2021 1511   GFRNONAA >60 09/02/2020 1502   Lab Results  Component Value Date   CHOL 211 (H) 11/30/2021   HDL 76.00 11/30/2021   LDLCALC 116 (H) 11/30/2021   TRIG 95.0 11/30/2021   CHOLHDL 3 11/30/2021   Lab Results  Component Value Date   HGBA1C 5.9  11/30/2021   No results found for: VITAMINB12 Lab Results  Component Value Date   TSH 1.61 11/30/2021    05/02/23 MRI brain Unremarkable MRI brain (with and without). No acute findings.    ASSESSMENT AND PLAN  68 y.o. year old male here with history of headaches with migraine features for past 20 to 30 years, worsening in the past 1 year.   Meds tried: sumatriptan , rizatriptan , tylenol   Dx:  1. Migraine with aura and without status migrainosus, not intractable   2. Migraine without status migrainosus, not intractable, unspecified migraine type      PLAN:  MIGRAINE WITH AURA  MIGRAINE TREATMENT PLAN:  MIGRAINE PREVENTION  LIFESTYLE CHANGES -Stop or avoid smoking -Decrease or avoid caffeine / alcohol -Eat and sleep on a regular schedule -Exercise several times per week - continue topiramate  50mg  daily (tried 50mg  twice a day but had side effects); drink plenty of water  MIGRAINE RESCUE  - ibuprofen, tylenol  as needed - rizatriptan  (Maxalt ) 10mg  as needed for breakthrough headache; may repeat x 1 after 2 hours; max 2 tabs per day or 8 per month - rimegepant (Nurtec) 75mg  as needed for breakthrough headache; max 8 per month  Meds ordered this encounter  Medications   Rimegepant Sulfate (NURTEC) 75 MG TBDP    Sig: Take 1 tablet (75 mg total) by mouth daily as needed.    Dispense:  8 tablet    Refill:  6   rizatriptan  (MAXALT ) 10 MG tablet    Sig: Take 1 tablet (10 mg total) by mouth as needed for migraine. May repeat in 2 hours if needed    Dispense:  10 tablet    Refill:  3   topiramate  (TOPAMAX ) 50 MG tablet    Sig: Take 1 tablet (50 mg total) by mouth 2 (two) times daily.    Dispense:  60 tablet     Refill:  2   Return in about 1 year (around 12/31/2024) for MyChart visit (15 min).   Virtual Visit via Video Note  I connected with Willie Carpenter on 01/01/24 at  2:15 PM EDT by a video enabled telemedicine application and verified that I am speaking with the correct person using two identifiers.   I discussed the limitations of evaluation and management by telemedicine and the availability of in person appointments. The patient expressed understanding and agreed to proceed.  Patient is at home and I am at the office.   I spent 15 minutes of face-to-face and non-face-to-face time with patient.  This included previsit chart review, lab review, study review, order entry, electronic health record documentation, patient education.      EDUARD FABIENE HANLON, MD 01/01/2024, 2:52 PM Certified in Neurology, Neurophysiology and Neuroimaging  [facility] [address], [address] [place], [place] [address] [phone]

## 2024-01-02 ENCOUNTER — Ambulatory Visit: Payer: Self-pay | Admitting: Family Medicine

## 2024-01-02 ENCOUNTER — Other Ambulatory Visit: Payer: Self-pay

## 2024-01-02 ENCOUNTER — Encounter: Payer: Self-pay | Admitting: Family Medicine

## 2024-01-02 ENCOUNTER — Telehealth: Payer: Self-pay | Admitting: Family Medicine

## 2024-01-02 VITALS — BP 120/80 | HR 66 | Temp 98.5°F | Wt 189.0 lb

## 2024-01-02 DIAGNOSIS — G4733 Obstructive sleep apnea (adult) (pediatric): Secondary | ICD-10-CM | POA: Insufficient documentation

## 2024-01-02 DIAGNOSIS — Z125 Encounter for screening for malignant neoplasm of prostate: Secondary | ICD-10-CM | POA: Diagnosis not present

## 2024-01-02 DIAGNOSIS — Z0000 Encounter for general adult medical examination without abnormal findings: Secondary | ICD-10-CM

## 2024-01-02 DIAGNOSIS — E785 Hyperlipidemia, unspecified: Secondary | ICD-10-CM

## 2024-01-02 DIAGNOSIS — H43392 Other vitreous opacities, left eye: Secondary | ICD-10-CM | POA: Diagnosis not present

## 2024-01-02 LAB — HEPATIC FUNCTION PANEL
ALT: 23 U/L (ref 0–53)
AST: 19 U/L (ref 0–37)
Albumin: 4.5 g/dL (ref 3.5–5.2)
Alkaline Phosphatase: 37 U/L — ABNORMAL LOW (ref 39–117)
Bilirubin, Direct: 0.1 mg/dL (ref 0.0–0.3)
Total Bilirubin: 0.6 mg/dL (ref 0.2–1.2)
Total Protein: 6.7 g/dL (ref 6.0–8.3)

## 2024-01-02 LAB — CBC WITH DIFFERENTIAL/PLATELET
Basophils Absolute: 0.1 K/uL (ref 0.0–0.1)
Basophils Relative: 1.3 % (ref 0.0–3.0)
Eosinophils Absolute: 0.1 K/uL (ref 0.0–0.7)
Eosinophils Relative: 3.3 % (ref 0.0–5.0)
HCT: 41.1 % (ref 39.0–52.0)
Hemoglobin: 13.8 g/dL (ref 13.0–17.0)
Lymphocytes Relative: 30.3 % (ref 12.0–46.0)
Lymphs Abs: 1.2 K/uL (ref 0.7–4.0)
MCHC: 33.7 g/dL (ref 30.0–36.0)
MCV: 91.1 fl (ref 78.0–100.0)
Monocytes Absolute: 0.5 K/uL (ref 0.1–1.0)
Monocytes Relative: 11.3 % (ref 3.0–12.0)
Neutro Abs: 2.1 K/uL (ref 1.4–7.7)
Neutrophils Relative %: 53.8 % (ref 43.0–77.0)
Platelets: 253 K/uL (ref 150.0–400.0)
RBC: 4.51 Mil/uL (ref 4.22–5.81)
RDW: 13 % (ref 11.5–15.5)
WBC: 4 K/uL (ref 4.0–10.5)

## 2024-01-02 LAB — PSA: PSA: 0.38 ng/mL (ref 0.10–4.00)

## 2024-01-02 LAB — BASIC METABOLIC PANEL WITH GFR
BUN: 16 mg/dL (ref 6–23)
CO2: 24 meq/L (ref 19–32)
Calcium: 9.1 mg/dL (ref 8.4–10.5)
Chloride: 104 meq/L (ref 96–112)
Creatinine, Ser: 0.8 mg/dL (ref 0.40–1.50)
GFR: 92.52 mL/min (ref >60.00)
Glucose, Bld: 101 mg/dL — ABNORMAL HIGH (ref 70–99)
Potassium: 4 meq/L (ref 3.5–5.1)
Sodium: 136 meq/L (ref 135–145)

## 2024-01-02 LAB — LIPID PANEL
Cholesterol: 194 mg/dL (ref 0–200)
HDL: 65 mg/dL (ref >39.00)
LDL Cholesterol: 115 mg/dL — ABNORMAL HIGH (ref 0–99)
NonHDL: 128.95
Total CHOL/HDL Ratio: 3
Triglycerides: 68 mg/dL (ref 0.0–149.0)
VLDL: 13.6 mg/dL (ref 0.0–40.0)

## 2024-01-02 LAB — HEMOGLOBIN A1C: Hgb A1c MFr Bld: 6 % (ref 4.6–6.5)

## 2024-01-02 LAB — TSH: TSH: 1.08 u[IU]/mL (ref 0.35–5.50)

## 2024-01-02 MED ORDER — ATORVASTATIN CALCIUM 20 MG PO TABS: 20.0000 mg | ORAL_TABLET | Freq: Every day | ORAL | 3 refills | Status: DC

## 2024-01-02 MED ORDER — FAMOTIDINE 40 MG PO TABS: 40.0000 mg | ORAL_TABLET | Freq: Every day | ORAL | 3 refills | Status: AC

## 2024-01-02 MED ORDER — KETOCONAZOLE 2 % EX CREA: 1.0000 | TOPICAL_CREAM | Freq: Two times a day (BID) | CUTANEOUS | 5 refills | Status: AC | PRN

## 2024-01-02 NOTE — Telephone Encounter (Signed)
 Copied from CRM [id number]. Topic: Referral - Status >> Jan 02, 2024  3:36 PM Mia F wrote: Reason for CRM: Dr Johnny sent info for pt to Dr Norleen Ludwig office . Dr Alvia office says they did not recieve anything but a paper with pt name and dr name on it. They did not recieve a referral and asked for the referral to be faxed to [phone] >> Jan 02, 2024  4:46 PM Deleta RAMAN wrote: The office is requesting these records as soon as possible due to conditions  >> Jan 02, 2024  4:44 PM Deleta RAMAN wrote: Ronalee from Dr. Alvia office is calling regarding notes on the patient. He would need medical records before the referral request please send this over manual to the fax number [phone]. Please contact [phone] for any questions and concerns.

## 2024-01-02 NOTE — Progress Notes (Signed)
 Subjective:    Patient ID: Willie Carpenter, male    DOB: 11-Nov-1955, 68 y.o.   MRN: [id number]  HPI Here for a well exam. He is doing well overall. He has had floaters in the left eye for a year or two, and he now asks for a referral to a specialist for these. He sees Dr. Donita for his migraines, and taling Topamax  has decreased his incidence of migraines from 20 a month to two. He uses a CPAP for sleep apnea.    Review of Systems  Constitutional: Negative.   HENT: Negative.    Eyes:  Positive for visual disturbance.  Respiratory: Negative.    Cardiovascular: Negative.   Gastrointestinal: Negative.   Genitourinary: Negative.   Musculoskeletal: Negative.   Skin: Negative.   Neurological: Negative.   Psychiatric/Behavioral: Negative.         Objective:   Physical Exam Constitutional:      General: He is not in acute distress.    Appearance: Normal appearance. He is well-developed. He is not diaphoretic.  HENT:     Head: Normocephalic and atraumatic.     Right Ear: External ear normal.     Left Ear: External ear normal.     Nose: Nose normal.     Mouth/Throat:     Pharynx: Carpenter oropharyngeal exudate.  Eyes:     General: Carpenter scleral icterus.       Right eye: Carpenter discharge.        Left eye: Carpenter discharge.     Conjunctiva/sclera: Conjunctivae normal.     Pupils: Pupils are equal, round, and reactive to light.  Neck:     Thyroid : Carpenter thyromegaly.     Vascular: Carpenter JVD.     Trachea: Carpenter tracheal deviation.  Cardiovascular:     Rate and Rhythm: Normal rate and regular rhythm.     Pulses: Normal pulses.     Heart sounds: Normal heart sounds. Carpenter murmur heard.    Carpenter friction rub. Carpenter gallop.  Pulmonary:     Effort: Pulmonary effort is normal. Carpenter respiratory distress.     Breath sounds: Normal breath sounds. Carpenter wheezing or rales.  Chest:     Chest wall: Carpenter tenderness.  Abdominal:     General: Bowel sounds are normal. There is Carpenter distension.     Palpations: Abdomen is soft.  There is Carpenter mass.     Tenderness: There is Carpenter abdominal tenderness. There is Carpenter guarding or rebound.  Genitourinary:    Penis: Normal. Carpenter tenderness.      Testes: Normal.     Prostate: Normal.     Rectum: Normal. Guaiac result negative.  Musculoskeletal:        General: Carpenter tenderness. Normal range of motion.     Cervical back: Neck supple.  Lymphadenopathy:     Cervical: Carpenter cervical adenopathy.  Skin:    General: Skin is warm and dry.     Coloration: Skin is not pale.     Findings: Carpenter erythema or rash.  Neurological:     General: Carpenter focal deficit present.     Mental Status: He is alert and oriented to person, place, and time.     Cranial Nerves: Carpenter cranial nerve deficit.     Motor: Carpenter abnormal muscle tone.     Coordination: Coordination normal.     Deep Tendon Reflexes: Reflexes are normal and symmetric. Reflexes normal.  Psychiatric:        Mood and Affect: Mood  normal.        Behavior: Behavior normal.        Thought Content: Thought content normal.        Judgment: Judgment normal.           Assessment & Plan:  Well exam. We discussed diet and exercise. Get fasting labs. Garnette Olmsted, MD

## 2024-01-02 NOTE — Telephone Encounter (Signed)
 Copied from CRM [id number]. Topic: Referral - Status >> Jan 02, 2024  3:36 PM Mia F wrote: Reason for CRM: Dr Johnny sent info for pt to Dr Norleen Ludwig office . Dr Alvia office says they did not recieve anything but a paper with pt name and dr name on it. They did not recieve a referral and asked for the referral to be faxed to [phone]

## 2024-01-03 ENCOUNTER — Telehealth: Payer: Self-pay

## 2024-01-03 ENCOUNTER — Ambulatory Visit: Payer: Self-pay | Admitting: Family Medicine

## 2024-01-03 NOTE — Telephone Encounter (Signed)
 Pt referral was faxed to Dr Alvia office. Confirmation received Copied from CRM [id number]. Topic: General - Call Back - No Documentation >> Jan 03, 2024  8:14 AM Rea ORN wrote: Reason for CRM: Pt calling to see if he referral was faxed to Dr. Norleen Alvia office. They only accept paper fax's. After looking in the pt chart, I advised that the request is in the system from yesterday to send and we are awaiting the clinic sending the fax. Pt is asking for the fax to be sent as soon as possible because he believes he has a detached retina and wants to be seen as soon as possible. Pt would like a call back at [phone] once faxed.

## 2024-01-24 ENCOUNTER — Encounter (INDEPENDENT_AMBULATORY_CARE_PROVIDER_SITE_OTHER): Admitting: Ophthalmology

## 2024-01-24 DIAGNOSIS — H43813 Vitreous degeneration, bilateral: Secondary | ICD-10-CM | POA: Diagnosis not present

## 2024-03-20 ENCOUNTER — Encounter: Payer: Self-pay | Admitting: Adult Health

## 2024-04-02 ENCOUNTER — Other Ambulatory Visit: Payer: Self-pay | Admitting: Family Medicine

## 2024-04-02 DIAGNOSIS — E785 Hyperlipidemia, unspecified: Secondary | ICD-10-CM

## 2024-04-29 ENCOUNTER — Telehealth: Payer: Self-pay

## 2024-04-29 ENCOUNTER — Other Ambulatory Visit (HOSPITAL_COMMUNITY): Payer: Self-pay

## 2024-04-29 NOTE — Telephone Encounter (Signed)
 Pharmacy Patient Advocate Encounter   Received notification from CoverMyMeds that prior authorization for Nurtec is required/requested.   Insurance verification completed.   The patient is insured through [facility] .   Per test claim: The current 30 day co-pay is, $0.  No PA needed at this time. This test claim was processed through [facility]- copay amounts may vary at other pharmacies due to pharmacy/plan contracts, or as the patient moves through the different stages of their insurance plan.

## 2024-11-13 ENCOUNTER — Ambulatory Visit: Admitting: Adult Health
